# Patient Record
Sex: Male | Born: 1950 | Race: White | Hispanic: No | Marital: Married | Smoking: Never smoker
Health system: Southern US, Community
[De-identification: ages and names within clinical notes are randomized; demographics above are authoritative.]

## PROBLEM LIST (undated history)

## (undated) DIAGNOSIS — Z789 Other specified health status: Secondary | ICD-10-CM

## (undated) DIAGNOSIS — K602 Anal fissure, unspecified: Secondary | ICD-10-CM

## (undated) HISTORY — DX: Anal fissure, unspecified: K60.2

## (undated) HISTORY — PX: KNEE ARTHROSCOPY W/ ACL RECONSTRUCTION: SHX1858

---

## 2009-08-26 LAB — HM DIABETES EYE EXAM

## 2010-07-26 DIAGNOSIS — K602 Anal fissure, unspecified: Secondary | ICD-10-CM

## 2010-07-26 HISTORY — DX: Anal fissure, unspecified: K60.2

## 2011-03-08 ENCOUNTER — Ambulatory Visit: Payer: Self-pay | Admitting: Internal Medicine

## 2011-04-10 ENCOUNTER — Encounter: Payer: Self-pay | Admitting: Internal Medicine

## 2011-04-10 ENCOUNTER — Ambulatory Visit (INDEPENDENT_AMBULATORY_CARE_PROVIDER_SITE_OTHER): Payer: BC Managed Care – PPO | Admitting: Internal Medicine

## 2011-04-10 ENCOUNTER — Other Ambulatory Visit (INDEPENDENT_AMBULATORY_CARE_PROVIDER_SITE_OTHER): Payer: BC Managed Care – PPO

## 2011-04-10 VITALS — BP 102/62 | HR 57 | Temp 97.0°F | Ht 74.0 in | Wt 187.0 lb

## 2011-04-10 DIAGNOSIS — Z0000 Encounter for general adult medical examination without abnormal findings: Secondary | ICD-10-CM | POA: Insufficient documentation

## 2011-04-10 DIAGNOSIS — R400 Somnolence: Secondary | ICD-10-CM | POA: Insufficient documentation

## 2011-04-10 DIAGNOSIS — E781 Pure hyperglyceridemia: Secondary | ICD-10-CM | POA: Insufficient documentation

## 2011-04-10 DIAGNOSIS — K602 Anal fissure, unspecified: Secondary | ICD-10-CM | POA: Insufficient documentation

## 2011-04-10 DIAGNOSIS — R404 Transient alteration of awareness: Secondary | ICD-10-CM

## 2011-04-10 DIAGNOSIS — Z23 Encounter for immunization: Secondary | ICD-10-CM

## 2011-04-10 LAB — URINALYSIS, ROUTINE W REFLEX MICROSCOPIC
Specific Gravity, Urine: >=1.03 (ref 1.000–1.030)
Total Protein, Urine: NEGATIVE
Urine Glucose: NEGATIVE
Urobilinogen, UA: 0.2 (ref 0.0–1.0)

## 2011-04-10 LAB — COMPREHENSIVE METABOLIC PANEL
ALT: 25 U/L (ref 0–53)
Alkaline Phosphatase: 76 U/L (ref 39–117)
Sodium: 144 mEq/L (ref 135–145)
Total Bilirubin: 0.9 mg/dL (ref 0.3–1.2)
Total Protein: 7.4 g/dL (ref 6.0–8.3)

## 2011-04-10 LAB — LIPID PANEL
HDL: 49 mg/dL (ref >39.00)
Total CHOL/HDL Ratio: 3
VLDL: 6.8 mg/dL (ref 0.0–40.0)

## 2011-04-10 LAB — CBC WITH DIFFERENTIAL/PLATELET
Eosinophils Relative: 1.7 % (ref 0.0–5.0)
HCT: 44.1 % (ref 39.0–52.0)
Hemoglobin: 15.1 g/dL (ref 13.0–17.0)
Lymphs Abs: 1.8 10*3/uL (ref 0.7–4.0)
MCV: 93.3 fl (ref 78.0–100.0)
Monocytes Relative: 9.9 % (ref 3.0–12.0)
Neutro Abs: 4.1 10*3/uL (ref 1.4–7.7)
WBC: 6.7 10*3/uL (ref 4.5–10.5)

## 2011-04-10 NOTE — Assessment & Plan Note (Signed)
FLP today 

## 2011-04-10 NOTE — Assessment & Plan Note (Signed)
Referral for sleep evaluation

## 2011-04-10 NOTE — Progress Notes (Signed)
  Subjective:    Patient ID: Stephen Robles, male    DOB: 12-04-1950, 60 y.o.   MRN: [id number]  HPI New to me for a complete physical but he also complains of a persistently painful anal fissure and that he falls asleep while driving - he did a sleep study 5 years ago for ? Reason but he tells me that the room was too cold so he did not fall asleep and the study did not reveal any useful info. He tells me that his wife sleeps too soundly to notice if he snores, thrashes, or has apnea but he does complain of insomnia with frequent awakenings.   Review of Systems  Constitutional: Negative.   HENT: Negative.   Eyes: Negative.   Respiratory: Negative.   Cardiovascular: Negative.   Gastrointestinal: Positive for rectal pain. Negative for nausea, vomiting, abdominal pain, diarrhea, constipation, blood in stool, abdominal distention and anal bleeding.  Genitourinary: Negative.   Musculoskeletal: Negative.   Skin: Negative.   Neurological: Negative.   Hematological: Negative.   Psychiatric/Behavioral: Negative.        Objective:   Physical Exam  Vitals reviewed. Constitutional: He is oriented to person, place, and time. He appears well-developed and well-nourished. No distress.  HENT:  Head: Normocephalic and atraumatic.  Right Ear: External ear normal.  Left Ear: External ear normal.  Nose: Nose normal.  Mouth/Throat: Oropharynx is clear and moist. No oropharyngeal exudate.  Eyes: Conjunctivae and EOM are normal. Pupils are equal, round, and reactive to light. Right eye exhibits no discharge. Left eye exhibits no discharge. No scleral icterus.  Neck: Normal range of motion. Neck supple. No JVD present. No tracheal deviation present. No thyromegaly present.  Cardiovascular: Normal rate, regular rhythm, normal heart sounds and intact distal pulses.  Exam reveals no gallop and no friction rub.   No murmur heard. Pulmonary/Chest: Effort normal and breath sounds normal. No stridor. No  respiratory distress. He has no wheezes. He has no rales. He exhibits no tenderness.  Abdominal: Soft. Bowel sounds are normal. He exhibits no distension and no mass. There is no tenderness. There is no rebound and no guarding. Hernia confirmed negative in the right inguinal area.  Genitourinary: Prostate normal, testes normal and penis normal. Rectal exam shows external hemorrhoid, internal hemorrhoid and fissure. Rectal exam shows no mass, no tenderness and anal tone normal. Guaiac negative stool. Prostate is not enlarged and not tender. Right testis shows no mass, no swelling and no tenderness. Right testis is descended. Cremasteric reflex is not absent on the right side. Left testis shows no mass, no swelling and no tenderness. Left testis is descended. Cremasteric reflex is not absent on the left side. Circumcised. No penile erythema or penile tenderness. No discharge found.  Musculoskeletal: Normal range of motion. He exhibits no edema and no tenderness.  Lymphadenopathy:    He has no cervical adenopathy.       Right: No inguinal adenopathy present.       Left: No inguinal adenopathy present.  Neurological: He is alert and oriented to person, place, and time. He has normal reflexes. He displays normal reflexes. No cranial nerve deficit. He exhibits normal muscle tone. Coordination normal.  Skin: Skin is warm and dry. No rash noted. He is not diaphoretic. No erythema. No pallor.  Psychiatric: He has a normal mood and affect. His behavior is normal. Judgment and thought content normal.         Assessment & Plan:

## 2011-04-10 NOTE — Assessment & Plan Note (Signed)
Exam done, labs ordered, and pt ed material was given 

## 2011-04-10 NOTE — Patient Instructions (Signed)
Health Maintenance in Males MAINTAIN REGULAR HEALTH EXAMS  Maintain a healthy diet and normal weight. Increased weight leads to problems with blood pressure and diabetes. Decrease fat in the diet and increase exercise. Obtain a proper diet from your caregiver if necessary.   High blood pressure causes heart and blood vessel problems. Check blood pressures regularly and keep your blood pressure at normal limits. Aerobic exercise helps this. Persistent elevations of blood pressure should be treated with medications if weight loss and exercise are ineffective.   Avoid smoking, drinking in excess (more than 2 drinks per day), or use of street drugs. Do not share needles with anyone. Ask for help if you need assistance or instructions on stopping the use of alcohol, cigarettes, or drugs.   Maintain normal blood lipids and cholesterol. Your caregiver can give you information to lower your risk of heart disease or stroke.   Ask your caregiver if you are in need of early heart disease screening because of a strong family history of heart disease or signs of elevated testosterone (male sex hormone) levels. These can predispose you to early heart disease.   Practice safe sex. Practicing safe sex decreases your risk for a sexually transmitted infection (STI). Some of the STIs are gonorrhea, chlamydia, syphilis, trichimonas, herpes, human papillomavirus (HPV), and human immunodeficiency virus (HIV). Herpes, HIV, and HPV are viral illnesses that have no cure. These can result in disability, cancer, and death.   It is not safe for someone who has AIDS or is HIV positive to have unprotected sex with a partner who is HIV positive. The reason for this is the fact that there are many different strains of HIV. If you have a strain that is readily treated with medications and then suddenly introduce a strain from a partner that has no further treatment options, you may suddenly have a strain of HIV that is untreatable.  Even if you are both positive for HIV, it is still necessary to practice safe sex.   Use sunscreen with a SPF of 15 or greater. Being outside in the sun when your shadow caused by the sun is shorter than you are, means you are being exposed to sun at greater intensity. Lighter skinned people are at a greater risk of skin cancer.   Keep carbon monoxide and smoke detectors in your home and functioning at all times. Change the batteries every 6 months.   Do monthly examinations of your testicles. The best time to do this is after a hot shower or bath when the tissues are loose. Notify your caregivers of any lumps, tenderness, or changes in size or shape.   Notify your caregiver of new moles or changes in moles, especially if there is a change in shape or color. Also notify your caregiver if a mole is larger than the size of a pencil eraser.   Stay current with your tetanus shots and other required immunizations.  The Body Mass Index (BMI) is a way of measuring how much of your body is fat. Having a BMI above 27 increases the risk of heart disease, diabetes, hypertension, stroke, and other problems related to obesity. Document Released: 02/08/2008 Document Re-Released: 01/30/2010 [employer] Patient Information 2011 [employer]. 

## 2011-04-11 ENCOUNTER — Telehealth: Payer: Self-pay

## 2011-04-11 MED ORDER — NONFORMULARY OR COMPOUNDED ITEM: Status: DC

## 2011-04-11 NOTE — Telephone Encounter (Signed)
Per pt he was told to call back and give MD the name of a RX for refill. Medication shows [id number] 2-3-3% apply to area 3-4 times daily. He wants this  [facility] at [phone]

## 2011-04-12 ENCOUNTER — Encounter: Payer: Self-pay | Admitting: Internal Medicine

## 2011-04-18 ENCOUNTER — Telehealth: Payer: Self-pay | Admitting: *Deleted

## 2011-04-18 NOTE — Telephone Encounter (Signed)
Pt left vm req lead level. Left pt VM to inform him that it was at the bottom of the 2nd page of lab letter (that he said in mess that he received)

## 2011-04-25 NOTE — Telephone Encounter (Signed)
error 

## 2011-05-09 ENCOUNTER — Institutional Professional Consult (permissible substitution): Payer: BC Managed Care – PPO | Admitting: Pulmonary Disease

## 2012-01-27 ENCOUNTER — Encounter (HOSPITAL_COMMUNITY): Payer: Self-pay | Admitting: Pharmacy Technician

## 2012-01-27 ENCOUNTER — Encounter (INDEPENDENT_AMBULATORY_CARE_PROVIDER_SITE_OTHER): Payer: BC Managed Care – PPO | Admitting: Ophthalmology

## 2012-01-27 ENCOUNTER — Encounter (HOSPITAL_COMMUNITY): Payer: Self-pay | Admitting: *Deleted

## 2012-01-27 DIAGNOSIS — H2510 Age-related nuclear cataract, unspecified eye: Secondary | ICD-10-CM

## 2012-01-27 DIAGNOSIS — H4310 Vitreous hemorrhage, unspecified eye: Secondary | ICD-10-CM

## 2012-01-27 DIAGNOSIS — H33009 Unspecified retinal detachment with retinal break, unspecified eye: Secondary | ICD-10-CM

## 2012-01-27 DIAGNOSIS — H43819 Vitreous degeneration, unspecified eye: Secondary | ICD-10-CM

## 2012-01-27 DIAGNOSIS — H33309 Unspecified retinal break, unspecified eye: Secondary | ICD-10-CM

## 2012-01-27 MED ORDER — GATIFLOXACIN 0.5 % OP SOLN
1.0000 [drp] | OPHTHALMIC | Status: AC | PRN
  Administered 2012-01-28 (×3): 1 [drp] via OPHTHALMIC
  Filled 2012-01-27: qty 2.5

## 2012-01-27 MED ORDER — TROPICAMIDE 1 % OP SOLN
1.0000 [drp] | OPHTHALMIC | Status: AC | PRN
  Administered 2012-01-28 (×3): 1 [drp] via OPHTHALMIC
  Filled 2012-01-27: qty 3

## 2012-01-27 MED ORDER — PHENYLEPHRINE HCL 2.5 % OP SOLN
1.0000 [drp] | OPHTHALMIC | Status: AC | PRN
  Administered 2012-01-28 (×3): 1 [drp] via OPHTHALMIC
  Filled 2012-01-27: qty 3

## 2012-01-27 MED ORDER — CYCLOPENTOLATE HCL 1 % OP SOLN
1.0000 [drp] | OPHTHALMIC | Status: AC | PRN
  Administered 2012-01-28 (×3): 1 [drp] via OPHTHALMIC
  Filled 2012-01-27: qty 2

## 2012-01-27 MED ORDER — CEFAZOLIN SODIUM 1-5 GM-% IV SOLN
1.0000 g | INTRAVENOUS | Status: AC
  Administered 2012-01-28: 1 g via INTRAVENOUS
  Filled 2012-01-27: qty 50

## 2012-01-27 NOTE — H&P (Signed)
Stephen Robles is an 61 y.o. male.   Chief Complaint: sudden loss of vision right eye HPI: Healthy man with myopia and retinal detachment right eye  Past Medical History  Diagnosis Date  . Anal fissure 07/2010    he saw ? surgeon in [place] and was told he needed surgery but he has not done that due to $    Past Surgical History  Procedure Date  . Knee arthroscopy w/ acl reconstruction     Family History  Problem Relation Age of Onset  . Cancer Neg Hx   . Diabetes Neg Hx   . Heart disease Neg Hx   . Hyperlipidemia Neg Hx   . Hypertension Neg Hx   . Kidney disease Neg Hx    Social History:  reports that he has never smoked. He does not have any smokeless tobacco history on file. He reports that he drinks about 1.2 ounces of alcohol per week. He reports that he does not use illicit drugs.  Allergies: No Known Allergies   (Not in a hospital admission)  Review of systems otherwise negative  There were no vitals taken for this visit.  Physical exam: Mental status: oriented x3. Eyes: See eye exam associated with this date of surgery in media tab.  Scanned in by scanning center Ears, Nose, Throat: within normal limits Neck: Within Normal limits General: within normal limits Chest: Within normal limits Breast: deferred Heart: Within normal limits Abdomen: Within normal limits GU: deferred Extremities: within normal limits Skin: within normal limits  Assessment/Plan Rhegmatogenous Retinal detachment right eye Plan: To [facility] for Scleral buckle, laser treatment and gas injection right eye  Lynna Zamorano, Beulah Gandy 01/27/2012, 1:28 PM

## 2012-01-28 ENCOUNTER — Encounter (HOSPITAL_COMMUNITY): Payer: Self-pay | Admitting: *Deleted

## 2012-01-28 ENCOUNTER — Ambulatory Visit (HOSPITAL_COMMUNITY)
Admission: RE | Admit: 2012-01-28 | Discharge: 2012-01-29 | Disposition: A | Discharge status: Home / Self Care | Payer: BC Managed Care – PPO | Source: Ambulatory Visit | Attending: Ophthalmology | Admitting: Ophthalmology

## 2012-01-28 ENCOUNTER — Encounter (HOSPITAL_COMMUNITY)
Admission: RE | Disposition: A | Discharge status: Home / Self Care | Payer: Self-pay | Source: Ambulatory Visit | Attending: Ophthalmology

## 2012-01-28 ENCOUNTER — Encounter (HOSPITAL_COMMUNITY): Payer: Self-pay | Admitting: Anesthesiology

## 2012-01-28 ENCOUNTER — Ambulatory Visit (HOSPITAL_COMMUNITY): Payer: BC Managed Care – PPO | Admitting: Anesthesiology

## 2012-01-28 DIAGNOSIS — H33009 Unspecified retinal detachment with retinal break, unspecified eye: Secondary | ICD-10-CM

## 2012-01-28 HISTORY — PX: SCLERAL BUCKLE: SHX5340

## 2012-01-28 HISTORY — PX: GAS INSERTION: SHX5336

## 2012-01-28 HISTORY — DX: Other specified health status: Z78.9

## 2012-01-28 LAB — CBC
Hemoglobin: 13.2 g/dL (ref 13.0–17.0)
MCH: 31.1 pg (ref 26.0–34.0)
MCHC: 33.8 g/dL (ref 30.0–36.0)
Platelets: 217 10*3/uL (ref 150–400)
RDW: 14 % (ref 11.5–15.5)

## 2012-01-28 LAB — SURGICAL PCR SCREEN
MRSA, PCR: NEGATIVE
Staphylococcus aureus: POSITIVE — AB

## 2012-01-28 SURGERY — SCLERAL BUCKLE
Anesthesia: General | Site: Eye | Laterality: Right | Wound class: Clean

## 2012-01-28 MED ORDER — LACTATED RINGERS IV SOLN: INTRAVENOUS | Status: DC | PRN

## 2012-01-28 MED ORDER — SODIUM CHLORIDE 0.9 % IV SOLN
INTRAVENOUS | Status: DC | PRN
  Administered 2012-01-28 (×2): via INTRAVENOUS

## 2012-01-28 MED ORDER — ONDANSETRON HCL 4 MG/2ML IJ SOLN
INTRAMUSCULAR | Status: DC | PRN
  Administered 2012-01-28: 4 mg via INTRAVENOUS

## 2012-01-28 MED ORDER — SODIUM CHLORIDE 0.9 % IJ SOLN
INTRAMUSCULAR | Status: DC | PRN
  Administered 2012-01-28: 14:00:00

## 2012-01-28 MED ORDER — ONDANSETRON HCL 4 MG/2ML IJ SOLN: 4.0000 mg | Freq: Four times a day (QID) | INTRAMUSCULAR | Status: DC | PRN

## 2012-01-28 MED ORDER — GLYCOPYRROLATE 0.2 MG/ML IJ SOLN
INTRAMUSCULAR | Status: DC | PRN
  Administered 2012-01-28: .5 mg via INTRAVENOUS

## 2012-01-28 MED ORDER — PROPOFOL 10 MG/ML IV EMUL
INTRAVENOUS | Status: DC | PRN
  Administered 2012-01-28: 150 mg via INTRAVENOUS

## 2012-01-28 MED ORDER — MAGNESIUM HYDROXIDE 400 MG/5ML PO SUSP: 15.0000 mL | Freq: Four times a day (QID) | ORAL | Status: DC | PRN

## 2012-01-28 MED ORDER — MUPIROCIN 2 % EX OINT
TOPICAL_OINTMENT | Freq: Two times a day (BID) | CUTANEOUS | Status: DC
  Administered 2012-01-28: 1 via NASAL
  Filled 2012-01-28: qty 22

## 2012-01-28 MED ORDER — MORPHINE SULFATE 2 MG/ML IJ SOLN
1.0000 mg | INTRAMUSCULAR | Status: DC | PRN
  Administered 2012-01-28: 2 mg via INTRAVENOUS

## 2012-01-28 MED ORDER — ROCURONIUM BROMIDE 100 MG/10ML IV SOLN
INTRAVENOUS | Status: DC | PRN
  Administered 2012-01-28: 10 mg via INTRAVENOUS
  Administered 2012-01-28: 50 mg via INTRAVENOUS
  Administered 2012-01-28 (×2): 20 mg via INTRAVENOUS

## 2012-01-28 MED ORDER — PREDNISOLONE ACETATE 1 % OP SUSP
1.0000 [drp] | Freq: Four times a day (QID) | OPHTHALMIC | Status: DC
  Filled 2012-01-28: qty 1

## 2012-01-28 MED ORDER — TETRACAINE HCL 0.5 % OP SOLN
2.0000 [drp] | Freq: Once | OPHTHALMIC | Status: DC
  Filled 2012-01-28: qty 2

## 2012-01-28 MED ORDER — HYDROMORPHONE HCL PF 1 MG/ML IJ SOLN: 0.2500 mg | INTRAMUSCULAR | Status: DC | PRN

## 2012-01-28 MED ORDER — DEXTROSE 5 % IV SOLN
INTRAVENOUS | Status: DC | PRN
  Administered 2012-01-28: 15:00:00 via INTRAVENOUS

## 2012-01-28 MED ORDER — ACETAMINOPHEN 325 MG PO TABS: 325.0000 mg | ORAL_TABLET | ORAL | Status: DC | PRN

## 2012-01-28 MED ORDER — DEXAMETHASONE SODIUM PHOSPHATE 10 MG/ML IJ SOLN
INTRAMUSCULAR | Status: DC | PRN
  Administered 2012-01-28: 10 mg

## 2012-01-28 MED ORDER — LIDOCAINE HCL (CARDIAC) 20 MG/ML IV SOLN
INTRAVENOUS | Status: DC | PRN
  Administered 2012-01-28: 50 mg via INTRAVENOUS

## 2012-01-28 MED ORDER — ATROPINE SULFATE 1 % OP SOLN
1.0000 [drp] | OPHTHALMIC | Status: DC
  Filled 2012-01-28: qty 2

## 2012-01-28 MED ORDER — MIDAZOLAM HCL 5 MG/5ML IJ SOLN
INTRAMUSCULAR | Status: DC | PRN
  Administered 2012-01-28: 2 mg via INTRAVENOUS

## 2012-01-28 MED ORDER — ACETAMINOPHEN 10 MG/ML IV SOLN: 1000.0000 mg | Freq: Once | INTRAVENOUS | Status: DC | PRN

## 2012-01-28 MED ORDER — SODIUM CHLORIDE 0.9 % IV SOLN: INTRAVENOUS | Status: DC

## 2012-01-28 MED ORDER — TEMAZEPAM 15 MG PO CAPS: 15.0000 mg | ORAL_CAPSULE | Freq: Every evening | ORAL | Status: DC | PRN

## 2012-01-28 MED ORDER — FENTANYL CITRATE 0.05 MG/ML IJ SOLN
INTRAMUSCULAR | Status: DC | PRN
  Administered 2012-01-28: 150 ug via INTRAVENOUS
  Administered 2012-01-28: 100 ug via INTRAVENOUS

## 2012-01-28 MED ORDER — BACITRACIN-POLYMYXIN B 500-10000 UNIT/GM OP OINT
TOPICAL_OINTMENT | OPHTHALMIC | Status: DC | PRN
  Administered 2012-01-28: 1 via OPHTHALMIC

## 2012-01-28 MED ORDER — SODIUM CHLORIDE 0.45 % IV SOLN: INTRAVENOUS | Status: DC

## 2012-01-28 MED ORDER — BSS IO SOLN
INTRAOCULAR | Status: DC | PRN
  Administered 2012-01-28: 15 mL via INTRAOCULAR

## 2012-01-28 MED ORDER — ACETAZOLAMIDE SODIUM 500 MG IJ SOLR
500.0000 mg | Freq: Once | INTRAMUSCULAR | Status: AC
  Administered 2012-01-29: 500 mg via INTRAVENOUS
  Filled 2012-01-28: qty 500

## 2012-01-28 MED ORDER — BACITRACIN-POLYMYXIN B 500-10000 UNIT/GM OP OINT
1.0000 "application " | TOPICAL_OINTMENT | Freq: Four times a day (QID) | OPHTHALMIC | Status: DC
  Filled 2012-01-28 (×3): qty 3.5

## 2012-01-28 MED ORDER — LIDOCAINE HCL 4 % MT SOLN
OROMUCOSAL | Status: DC | PRN
  Administered 2012-01-28: 4 mL via TOPICAL

## 2012-01-28 MED ORDER — GATIFLOXACIN 0.5 % OP SOLN
1.0000 [drp] | Freq: Four times a day (QID) | OPHTHALMIC | Status: DC
  Filled 2012-01-28: qty 2.5

## 2012-01-28 MED ORDER — BUPIVACAINE HCL 0.75 % IJ SOLN
INTRAMUSCULAR | Status: DC | PRN
  Administered 2012-01-28: 10 mL

## 2012-01-28 MED ORDER — LATANOPROST 0.005 % OP SOLN
1.0000 [drp] | Freq: Every day | OPHTHALMIC | Status: DC
  Filled 2012-01-28: qty 2.5

## 2012-01-28 MED ORDER — HEMOSTATIC AGENTS (NO CHARGE) OPTIME
TOPICAL | Status: DC | PRN
  Administered 2012-01-28: 1 via TOPICAL

## 2012-01-28 MED ORDER — BRIMONIDINE TARTRATE 0.2 % OP SOLN
1.0000 [drp] | Freq: Two times a day (BID) | OPHTHALMIC | Status: DC
  Filled 2012-01-28: qty 5

## 2012-01-28 MED ORDER — MUPIROCIN 2 % EX OINT
TOPICAL_OINTMENT | CUTANEOUS | Status: AC
  Administered 2012-01-28: 1 via NASAL
  Filled 2012-01-28: qty 22

## 2012-01-28 MED ORDER — OXYCODONE-ACETAMINOPHEN 5-325 MG PO TABS: 1.0000 | ORAL_TABLET | ORAL | Status: DC | PRN

## 2012-01-28 MED ORDER — MORPHINE SULFATE 2 MG/ML IJ SOLN
INTRAMUSCULAR | Status: AC
  Filled 2012-01-28: qty 1

## 2012-01-28 MED ORDER — ATROPINE SULFATE 1 % OP SOLN
OPHTHALMIC | Status: DC | PRN
  Administered 2012-01-28: 1 [drp] via OPHTHALMIC

## 2012-01-28 MED ORDER — ONDANSETRON HCL 4 MG/2ML IJ SOLN: 4.0000 mg | Freq: Once | INTRAMUSCULAR | Status: DC | PRN

## 2012-01-28 MED ORDER — NEOSTIGMINE METHYLSULFATE 1 MG/ML IJ SOLN
INTRAMUSCULAR | Status: DC | PRN
  Administered 2012-01-28: 4 mg via INTRAVENOUS

## 2012-01-28 SURGICAL SUPPLY — 66 items
APPLICATOR DR MATTHEWS STRL (MISCELLANEOUS) ×20 IMPLANT
BAND SCLERAL BUCKLING TYPE 240 (Ophthalmic Related) ×2 IMPLANT
BLADE EYE CATARACT 19 1.4 BEAV (BLADE) IMPLANT
BLADE MVR KNIFE 19G (BLADE) IMPLANT
BLADE SURG 15 STRL LF DISP TIS (BLADE) IMPLANT
BLADE SURG 15 STRL SS (BLADE)
CANNULA ANT CHAM MAIN (OPHTHALMIC RELATED) IMPLANT
CANNULA DUAL BORE 23G (CANNULA) IMPLANT
CORDS BIPOLAR (ELECTRODE) IMPLANT
COTTONBALL LRG STERILE PKG (GAUZE/BANDAGES/DRESSINGS) ×6 IMPLANT
COVER SURGICAL LIGHT HANDLE (MISCELLANEOUS) ×2 IMPLANT
DRAPE OPHTHALMIC 77X100 STRL (CUSTOM PROCEDURE TRAY) ×2 IMPLANT
ERASER HMR WETFIELD 23G BP (MISCELLANEOUS) IMPLANT
FILTER BLUE MILLIPORE (MISCELLANEOUS) ×4 IMPLANT
FILTER STRAW FLUID ASPIR (MISCELLANEOUS) IMPLANT
GAS OPHTHALMIC (MISCELLANEOUS) ×2 IMPLANT
GLOVE SS BIOGEL STRL SZ 6.5 (GLOVE) ×3 IMPLANT
GLOVE SS BIOGEL STRL SZ 7 (GLOVE) ×1 IMPLANT
GLOVE SUPERSENSE BIOGEL SZ 6.5 (GLOVE) ×3
GLOVE SUPERSENSE BIOGEL SZ 7 (GLOVE) ×1
GLOVE SURG 8.5 LATEX PF (GLOVE) ×2 IMPLANT
GOWN STRL NON-REIN LRG LVL3 (GOWN DISPOSABLE) ×8 IMPLANT
ILLUMINATOR CHOW PICK 25GA (MISCELLANEOUS) IMPLANT
KIT BASIN OR (CUSTOM PROCEDURE TRAY) ×2 IMPLANT
KIT PERFLUORON PROCEDURE 5ML (MISCELLANEOUS) IMPLANT
KIT ROOM TURNOVER OR (KITS) ×2 IMPLANT
KNIFE CRESCENT 1.75 EDGEAHEAD (BLADE) IMPLANT
KNIFE GRIESHABER SHARP 2.5MM (MISCELLANEOUS) ×6 IMPLANT
MARKER SKIN DUAL TIP RULER LAB (MISCELLANEOUS) IMPLANT
NEEDLE 18GX1X1/2 (RX/OR ONLY) (NEEDLE) ×2 IMPLANT
NEEDLE 25GX 5/8IN NON SAFETY (NEEDLE) IMPLANT
NEEDLE 27GAX1X1/2 (NEEDLE) IMPLANT
NEEDLE HYPO 30X.5 LL (NEEDLE) ×4 IMPLANT
NS IRRIG 1000ML POUR BTL (IV SOLUTION) ×2 IMPLANT
PACK VITRECTOMY CUSTOM (CUSTOM PROCEDURE TRAY) ×2 IMPLANT
PAD ARMBOARD 7.5X6 YLW CONV (MISCELLANEOUS) ×4 IMPLANT
PAD EYE OVAL STERILE LF (GAUZE/BANDAGES/DRESSINGS) IMPLANT
PAK VITRECTOMY PIK 25 GA (OPHTHALMIC RELATED) IMPLANT
PROBE DIRECTIONAL LASER (MISCELLANEOUS) IMPLANT
REPL STRA BRUSH NEEDLE (NEEDLE) IMPLANT
RESERVOIR BACK FLUSH (MISCELLANEOUS) IMPLANT
ROLLS DENTAL (MISCELLANEOUS) ×4 IMPLANT
SET FLUID INJECTOR (SET/KITS/TRAYS/PACK) IMPLANT
SET VGFI TUBING 8065808002 (SET/KITS/TRAYS/PACK) IMPLANT
SPEAR EYE SURG WECK-CEL (MISCELLANEOUS) ×6 IMPLANT
SPONGE GROOVED SILICONE 4X12X8 (Ophthalmic Related) ×2 IMPLANT
SPONGE SURGIFOAM ABS GEL 12-7 (HEMOSTASIS) ×2 IMPLANT
STOPCOCK 4 WAY LG BORE MALE ST (IV SETS) IMPLANT
SUT CHROMIC 7 0 TG140 8 (SUTURE) ×2 IMPLANT
SUT ETHILON 9 0 TG140 8 (SUTURE) IMPLANT
SUT MERSILENE 4 0 RV 2 (SUTURE) ×4 IMPLANT
SUT SILK 2 0 (SUTURE) ×1
SUT SILK 2-0 18XBRD TIE 12 (SUTURE) ×1 IMPLANT
SUT SILK 4 0 RB 1 (SUTURE) ×2 IMPLANT
SUT VICRYL 7 0 TG140 8 (SUTURE) IMPLANT
SYR 20CC LL (SYRINGE) ×2 IMPLANT
SYR 5ML LL (SYRINGE) IMPLANT
SYR BULB 3OZ (MISCELLANEOUS) ×2 IMPLANT
SYR TB 1ML LUER SLIP (SYRINGE) IMPLANT
SYRINGE 10CC LL (SYRINGE) ×2 IMPLANT
TIRE 11 SCLERAL TYPE 279 (Ophthalmic Related) ×2 IMPLANT
TOWEL OR 17X24 6PK STRL BLUE (TOWEL DISPOSABLE) ×6 IMPLANT
TUBING ART PRESS 12 MALE/MALE (MISCELLANEOUS) IMPLANT
VITREORETINAL VISCODISSEC (MISCELLANEOUS) IMPLANT
WATER STERILE IRR 1000ML POUR (IV SOLUTION) ×2 IMPLANT
WIPE INSTRUMENT VISIWIPE 73X73 (MISCELLANEOUS) ×2 IMPLANT

## 2012-01-28 NOTE — Procedures (Signed)
Brief Operative note   Preoperative diagnosis:  Pre-Op Diagnosis Codes:    * Retinal detachment with retinal defect, unspecified [361.00] Postoperative diagnosis  Post-Op Diagnosis Codes:    * Retinal detachment with retinal defect, unspecified [361.00]  Procedures: Scleral Buckle right eye with laser treatment and gas injection  Surgeon:  Sherrie George, MD  Assistant:  Rosalie Doctor SA   Anesthesia: General  Specimen: none  Estimated blood loss:  1cc  Complications: none  Patient sent to PACU in good condition  Composed by Sherrie George MD  Dictation number: [phone]

## 2012-01-28 NOTE — Anesthesia Procedure Notes (Signed)
Procedure Name: Intubation Date/Time: 01/28/2012 2:38 PM Performed by: Shawanda Sievert S Pre-anesthesia Checklist: Patient identified, Emergency Drugs available, Suction available, Patient being monitored and Timeout performed Patient Re-evaluated:Patient Re-evaluated prior to inductionOxygen Delivery Method: Circle system utilized Preoxygenation: Pre-oxygenation with 100% oxygen Intubation Type: IV induction Ventilation: Mask ventilation without difficulty Laryngoscope Size: Mac and 4 Grade View: Grade I Tube type: Oral Tube size: 7.5 mm Number of attempts: 1 Airway Equipment and Method: Stylet Placement Confirmation: ETT inserted through vocal cords under direct vision,  positive ETCO2 and breath sounds checked- equal and bilateral Secured at: 22 cm Tube secured with: Tape Dental Injury: Teeth and Oropharynx as per pre-operative assessment

## 2012-01-28 NOTE — Preoperative (Signed)
Beta Blockers   Reason not to administer Beta Blockers:Not Applicable 

## 2012-01-28 NOTE — Transfer of Care (Signed)
Immediate Anesthesia Transfer of Care Note  Patient: Stephen Robles  Procedure(s) Performed: Procedure(s) (LRB): SCLERAL BUCKLE (Right) INSERTION OF GAS (Right) DIODE LASER APPLICATION (Right)  Patient Location: PACU  Anesthesia Type: General  Level of Consciousness: sedated  Airway & Oxygen Therapy: Patient Spontanous Breathing and Patient connected to nasal cannula oxygen  Post-op Assessment: Report given to PACU RN and Post -op Vital signs reviewed and stable  Post vital signs: Reviewed and stable  Complications: No apparent anesthesia complications

## 2012-01-28 NOTE — Anesthesia Preprocedure Evaluation (Addendum)
Anesthesia Evaluation  Patient identified by MRN, date of birth, ID band Patient awake    Reviewed: Allergy & Precautions, H&P , NPO status , Patient's Chart, lab work & pertinent test results  Airway Mallampati: I  Neck ROM: Full    Dental  (+) Teeth Intact and Dental Advisory Given   Pulmonary neg pulmonary ROS,  breath sounds clear to auscultation        Cardiovascular negative cardio ROS  Rhythm:Regular Rate:Normal     Neuro/Psych negative neurological ROS  negative psych ROS   GI/Hepatic negative GI ROS, Neg liver ROS,   Endo/Other  negative endocrine ROS  Renal/GU negative Renal ROS     Musculoskeletal negative musculoskeletal ROS (+)   Abdominal   Peds  Hematology negative hematology ROS (+)   Anesthesia Other Findings   Reproductive/Obstetrics                         Anesthesia Physical Anesthesia Plan  ASA: II  Anesthesia Plan: General   Post-op Pain Management:    Induction: Intravenous  Airway Management Planned: Oral ETT  Additional Equipment:   Intra-op Plan:   Post-operative Plan: Extubation in OR  Informed Consent: I have reviewed the patients History and Physical, chart, labs and discussed the procedure including the risks, benefits and alternatives for the proposed anesthesia with the patient or authorized representative who has indicated his/her understanding and acceptance.   Dental advisory given  Plan Discussed with: CRNA and Surgeon  Anesthesia Plan Comments: (Retinal Detacment  Plan GA with oral ETT  Kipp Brood, MD)       Anesthesia Quick Evaluation

## 2012-01-28 NOTE — H&P (Signed)
I examined the patient today and there is no change in the medical status 

## 2012-01-28 NOTE — Op Note (Signed)
NAMEJAMY, Stephen Robles NO.:  [id number]  MEDICAL RECORD NO.:  [id number]  LOCATION:  5126                         FACILITY:  [facility]  PHYSICIAN:  Beulah Gandy. Ashley Royalty, M.D. DATE OF BIRTH:  [birth year]  DATE OF PROCEDURE:  01/28/2012 DATE OF DISCHARGE:                              OPERATIVE REPORT   ADMISSION DIAGNOSIS:  Rhegmatogenous retinal detachment, right eye.  PROCEDURE:  Scleral buckle, right eye; gas fluid exchange, right eye; retinal photocoagulation right eye.  SURGEON:  Beulah Gandy. Ashley Royalty, MD  ASSISTANT:  Rosalie Doctor, SA  ANESTHESIA:  General.  DETAILS:  After usual prep and drape, 360 degree limbal peritomy, isolation of 4 rectus muscles on 2-0 silk scleral dissection for 360 degrees to admit #279 intrascleral implant.  Diathermy placed in the bed.  Additional posterior dissection carried out at 1:30 o'clock beneath the retinal break, 279 implant placed around the globe with 1 mm trim from the posterior edge.  The joint was at 7 o'clock.  240 band placed around the eye with a 270 sleeve at 8 o'clock.  Two sutures per quadrant for a total of 8 scleral sutures were placed.  Perforation site chosen at 11 o'clock in the posterior aspect of the bed.  Large amount of clear colorless subretinal fluid came forth in a controlled manner. C3F8 50% 0.8 mL was injected into the vitreous cavity to reinflate the globe.  Indirect ophthalmoscopy showed the retina to be lying nicely on the scleral buckle with break well supported.  Buckle elements were trimmed and adjusted.  The band was adjusted and trimmed.  Scleral sutures were knotted and the free ends removed.  The indirect ophthalmoscope laser was moved into place.  1118 burns placed around the retinal periphery.  The power was between 500 and 900 mW 1000 microns each and 0.1 seconds each.  The conjunctiva was reapproximated with 7-0 chromic suture.  Polymyxin and gentamicin were irrigated into tenon space.   Atropine solution was applied.  Marcaine was injected around the globe for postop pain.  Decadron 10 mg was injected to the lower subconjunctival space.  Closing pressure was 10 with a Barraquer tonometer.  COMPLICATIONS:  None.  DURATION:  Two hours.  The patient was awakened and taken to recovery in satisfactory condition.     Beulah Gandy. Ashley Royalty, M.D.     JDM/MEDQ  D:  01/28/2012  T:  01/28/2012  Job:  [id number]

## 2012-01-28 NOTE — Anesthesia Postprocedure Evaluation (Signed)
  Anesthesia Post-op Note  Patient: Stephen Robles  Procedure(s) Performed: Procedure(s) (LRB): SCLERAL BUCKLE (Right) INSERTION OF GAS (Right) DIODE LASER APPLICATION (Right)  Patient Location: PACU  Anesthesia Type: General  Level of Consciousness: awake, sedated and patient cooperative  Airway and Oxygen Therapy: Patient Spontanous Breathing and Patient connected to nasal cannula oxygen  Post-op Pain: none  Post-op Assessment: Post-op Vital signs reviewed, Patient's Cardiovascular Status Stable, Respiratory Function Stable, Patent Airway, No signs of Nausea or vomiting and Pain level controlled  Post-op Vital Signs: stable  Complications: No apparent anesthesia complications

## 2012-01-29 ENCOUNTER — Encounter (HOSPITAL_COMMUNITY): Payer: Self-pay | Admitting: Ophthalmology

## 2012-01-29 MED ORDER — GATIFLOXACIN 0.5 % OP SOLN: 1.0000 [drp] | Freq: Four times a day (QID) | OPHTHALMIC | Status: DC

## 2012-01-29 MED ORDER — BRIMONIDINE TARTRATE 0.2 % OP SOLN: 1.0000 [drp] | Freq: Two times a day (BID) | OPHTHALMIC | Status: AC

## 2012-01-29 MED ORDER — PREDNISOLONE ACETATE 1 % OP SUSP: 1.0000 [drp] | Freq: Four times a day (QID) | OPHTHALMIC | Status: AC

## 2012-01-29 MED ORDER — BACITRACIN-POLYMYXIN B 500-10000 UNIT/GM OP OINT: 1.0000 "application " | TOPICAL_OINTMENT | Freq: Four times a day (QID) | OPHTHALMIC | Status: AC

## 2012-01-29 MED ORDER — OXYCODONE-ACETAMINOPHEN 5-325 MG PO TABS: 1.0000 | ORAL_TABLET | ORAL | Status: AC | PRN

## 2012-01-29 NOTE — Progress Notes (Signed)
01/29/2012, 6:39 AM  Mental Status:  Awake, Alert, Oriented  Anterior segment: Cornea  Clear    Anterior Chamber Clear    Lens:   Clear, IOL, Cataract  Intra Ocular Pressure 20 mmHg with Tonopen  Vitreous: Clear 50%gas bubble   Retina:  Attached Good laser reaction   Impression: Excellent result Retina attached   Final Diagnosis: Principal Problem:  *Rhegmatogenous retinal detachment   Plan: start post operative eye drops.  Discharge to home.  Give post operative instructions  Sherrie George 01/29/2012, 6:39 AM

## 2012-01-29 NOTE — Discharge Summary (Signed)
Discharge summary not needed on [facility] patients per medical records. 

## 2012-02-04 ENCOUNTER — Ambulatory Visit (INDEPENDENT_AMBULATORY_CARE_PROVIDER_SITE_OTHER): Payer: BC Managed Care – PPO | Admitting: Ophthalmology

## 2012-02-04 DIAGNOSIS — H33009 Unspecified retinal detachment with retinal break, unspecified eye: Secondary | ICD-10-CM

## 2012-02-17 ENCOUNTER — Ambulatory Visit (INDEPENDENT_AMBULATORY_CARE_PROVIDER_SITE_OTHER): Payer: BC Managed Care – PPO | Admitting: Ophthalmology

## 2012-02-17 DIAGNOSIS — H33009 Unspecified retinal detachment with retinal break, unspecified eye: Secondary | ICD-10-CM

## 2012-03-03 NOTE — Progress Notes (Signed)
This encounter was created in error - please disregard.

## 2012-04-30 ENCOUNTER — Other Ambulatory Visit (INDEPENDENT_AMBULATORY_CARE_PROVIDER_SITE_OTHER): Payer: BC Managed Care – PPO

## 2012-04-30 ENCOUNTER — Ambulatory Visit (INDEPENDENT_AMBULATORY_CARE_PROVIDER_SITE_OTHER): Payer: BC Managed Care – PPO | Admitting: Internal Medicine

## 2012-04-30 ENCOUNTER — Encounter: Payer: Self-pay | Admitting: Internal Medicine

## 2012-04-30 VITALS — BP 126/70 | HR 48 | Temp 98.7°F | Resp 16 | Wt 184.0 lb

## 2012-04-30 DIAGNOSIS — H33009 Unspecified retinal detachment with retinal break, unspecified eye: Secondary | ICD-10-CM

## 2012-04-30 DIAGNOSIS — Z0000 Encounter for general adult medical examination without abnormal findings: Secondary | ICD-10-CM

## 2012-04-30 DIAGNOSIS — N529 Male erectile dysfunction, unspecified: Secondary | ICD-10-CM

## 2012-04-30 DIAGNOSIS — Z23 Encounter for immunization: Secondary | ICD-10-CM

## 2012-04-30 LAB — COMPREHENSIVE METABOLIC PANEL
ALT: 29 U/L (ref 0–53)
Albumin: 4.2 g/dL (ref 3.5–5.2)
CO2: 30 mEq/L (ref 19–32)
Chloride: 101 mEq/L (ref 96–112)
GFR: 84.23 mL/min (ref >60.00)
Glucose, Bld: 77 mg/dL (ref 70–99)
Potassium: 4.5 mEq/L (ref 3.5–5.1)
Sodium: 138 mEq/L (ref 135–145)
Total Protein: 7.5 g/dL (ref 6.0–8.3)

## 2012-04-30 LAB — URINALYSIS, ROUTINE W REFLEX MICROSCOPIC
Leukocytes, UA: NEGATIVE
Nitrite: NEGATIVE
Specific Gravity, Urine: <=1.005 (ref 1.000–1.030)
Urine Glucose: NEGATIVE
Urobilinogen, UA: 0.2 (ref 0.0–1.0)

## 2012-04-30 LAB — TSH: TSH: 1.57 u[IU]/mL (ref 0.35–5.50)

## 2012-04-30 LAB — CBC WITH DIFFERENTIAL/PLATELET
Basophils Absolute: 0.1 10*3/uL (ref 0.0–0.1)
Eosinophils Relative: 1 % (ref 0.0–5.0)
Monocytes Absolute: 0.6 10*3/uL (ref 0.1–1.0)
Monocytes Relative: 6.9 % (ref 3.0–12.0)
Neutrophils Relative %: 63.8 % (ref 43.0–77.0)
Platelets: 271 10*3/uL (ref 150.0–400.0)
RDW: 13.6 % (ref 11.5–14.6)
WBC: 8.4 10*3/uL (ref 4.5–10.5)

## 2012-04-30 LAB — FECAL OCCULT BLOOD, GUAIAC: Fecal Occult Blood: NEGATIVE

## 2012-04-30 LAB — PSA: PSA: 0.81 ng/mL (ref 0.10–4.00)

## 2012-04-30 MED ORDER — SILDENAFIL CITRATE 100 MG PO TABS: 50.0000 mg | ORAL_TABLET | Freq: Every day | ORAL | Status: DC | PRN

## 2012-04-30 NOTE — Progress Notes (Signed)
  Subjective:    Patient ID: Stephen Robles, male    DOB: Jun 01, 1951, 61 y.o.   MRN: [id number]  HPI  He returns for a complete physical - his only complaint is ED and he wants to try viagra.  Review of Systems  Constitutional: Negative.   HENT: Negative.   Eyes: Negative.   Respiratory: Negative.   Cardiovascular: Negative.   Gastrointestinal: Negative.   Genitourinary: Negative.   Musculoskeletal: Negative.   Skin: Negative.   Neurological: Negative.   Hematological: Negative.   Psychiatric/Behavioral: Negative.        Objective:   Physical Exam  Vitals reviewed. Constitutional: He is oriented to person, place, and time. He appears well-developed and well-nourished. No distress.  HENT:  Head: Normocephalic and atraumatic.  Mouth/Throat: Oropharynx is clear and moist. No oropharyngeal exudate.  Eyes: Conjunctivae are normal. Right eye exhibits no discharge. Left eye exhibits no discharge. No scleral icterus.  Neck: Normal range of motion. Neck supple. No JVD present. No tracheal deviation present. No thyromegaly present.  Cardiovascular: Normal rate, regular rhythm, normal heart sounds and intact distal pulses.  Exam reveals no gallop and no friction rub.   No murmur heard. Pulmonary/Chest: Effort normal and breath sounds normal. No stridor. No respiratory distress. He has no wheezes. He has no rales. He exhibits no tenderness.  Abdominal: Soft. Bowel sounds are normal. He exhibits no distension. There is no tenderness. There is no rebound and no guarding. Hernia confirmed negative in the right inguinal area and confirmed negative in the left inguinal area.  Genitourinary: Prostate normal, testes normal and penis normal. Rectal exam shows external hemorrhoid and internal hemorrhoid. Rectal exam shows no fissure, no mass, no tenderness and anal tone normal. Guaiac negative stool. Prostate is not enlarged and not tender. Right testis shows no mass, no swelling and no tenderness.  Right testis is descended. Left testis shows no mass, no swelling and no tenderness. Left testis is descended. Circumcised. No phimosis, paraphimosis, hypospadias, penile erythema or penile tenderness. No discharge found.  Musculoskeletal: Normal range of motion. He exhibits no edema and no tenderness.  Lymphadenopathy:    He has no cervical adenopathy.       Right: No inguinal adenopathy present.       Left: No inguinal adenopathy present.  Neurological: He is oriented to person, place, and time.  Skin: Skin is warm and dry. No rash noted. He is not diaphoretic. No erythema. No pallor.  Psychiatric: He has a normal mood and affect. His behavior is normal. Judgment and thought content normal.      Lab Results  Component Value Date   WBC 7.2 01/28/2012   HGB 13.2 01/28/2012   HCT 39.1 01/28/2012   PLT 217 01/28/2012   GLUCOSE 95 04/10/2011   CHOL 151 04/10/2011   TRIG 34.0 04/10/2011   HDL 49.00 04/10/2011   LDLCALC 95 04/10/2011   ALT 25 04/10/2011   AST 20 04/10/2011   NA 144 04/10/2011   K 4.7 04/10/2011   CL 107 04/10/2011   CREATININE 1.0 04/10/2011   BUN 21 04/10/2011   CO2 31 04/10/2011   TSH 0.81 04/10/2011   PSA 0.87 04/10/2011      Assessment & Plan:

## 2012-04-30 NOTE — Patient Instructions (Signed)
Health Maintenance, Males A healthy lifestyle and preventative care can promote health and wellness.  Maintain regular health, dental, and eye exams.   Eat a healthy diet. Foods like vegetables, fruits, whole grains, low-fat dairy products, and lean protein foods contain the nutrients you need without too many calories. Decrease your intake of foods high in solid fats, added sugars, and salt. Get information about a proper diet from your caregiver, if necessary.   Regular physical exercise is one of the most important things you can do for your health. Most adults should get at least 150 minutes of moderate-intensity exercise (any activity that increases your heart rate and causes you to sweat) each week. In addition, most adults need muscle-strengthening exercises on 2 or more days a week.    Maintain a healthy weight. The body mass index (BMI) is a screening tool to identify possible weight problems. It provides an estimate of body fat based on height and weight. Your caregiver can help determine your BMI, and can help you achieve or maintain a healthy weight. For adults 20 years and older:   A BMI below 18.5 is considered underweight.   A BMI of 18.5 to 24.9 is normal.   A BMI of 25 to 29.9 is considered overweight.   A BMI of 30 and above is considered obese.   Maintain normal blood lipids and cholesterol by exercising and minimizing your intake of saturated fat. Eat a balanced diet with plenty of fruits and vegetables. Blood tests for lipids and cholesterol should begin at age 20 and be repeated every 5 years. If your lipid or cholesterol levels are high, you are over 50, or you are a high risk for heart disease, you may need your cholesterol levels checked more frequently.Ongoing high lipid and cholesterol levels should be treated with medicines, if diet and exercise are not effective.   If you smoke, find out from your caregiver how to quit. If you do not use tobacco, do not start.    If you choose to drink alcohol, do not exceed 2 drinks per day. One drink is considered to be 12 ounces (355 mL) of beer, 5 ounces (148 mL) of wine, or 1.5 ounces (44 mL) of liquor.   Avoid use of street drugs. Do not share needles with anyone. Ask for help if you need support or instructions about stopping the use of drugs.   High blood pressure causes heart disease and increases the risk of stroke. Blood pressure should be checked at least every 1 to 2 years. Ongoing high blood pressure should be treated with medicines if weight loss and exercise are not effective.   If you are 45 to 62 years old, ask your caregiver if you should take aspirin to prevent heart disease.   Diabetes screening involves taking a blood sample to check your fasting blood sugar level. This should be done once every 3 years, after age 45, if you are within normal weight and without risk factors for diabetes. Testing should be considered at a younger age or be carried out more frequently if you are overweight and have at least 1 risk factor for diabetes.   Colorectal cancer can be detected and often prevented. Most routine colorectal cancer screening begins at the age of 50 and continues through age 75. However, your caregiver may recommend screening at an earlier age if you have risk factors for colon cancer. On a yearly basis, your caregiver may provide home test kits to check for hidden   blood in the stool. Use of a small camera at the end of a tube, to directly examine the colon (sigmoidoscopy or colonoscopy), can detect the earliest forms of colorectal cancer. Talk to your caregiver about this at age 50, when routine screening begins. Direct examination of the colon should be repeated every 5 to 10 years through age 75, unless early forms of pre-cancerous polyps or small growths are found.   Hepatitis C blood testing is recommended for all people born from 1945 through 1965 and any individual with known risks for  hepatitis C.   Healthy men should no longer receive prostate-specific antigen (PSA) blood tests as part of routine cancer screening. Consult with your caregiver about prostate cancer screening.   Testicular cancer screening is not recommended for adolescents or adult males who have no symptoms. Screening includes self-exam, caregiver exam, and other screening tests. Consult with your caregiver about any symptoms you have or any concerns you have about testicular cancer.   Practice safe sex. Use condoms and avoid high-risk sexual practices to reduce the spread of sexually transmitted infections (STIs).   Use sunscreen with a sun protection factor (SPF) of 30 or greater. Apply sunscreen liberally and repeatedly throughout the day. You should seek shade when your shadow is shorter than you. Protect yourself by wearing long sleeves, pants, a wide-brimmed hat, and sunglasses year round, whenever you are outdoors.   Notify your caregiver of new moles or changes in moles, especially if there is a change in shape or color. Also notify your caregiver if a mole is larger than the size of a pencil eraser.   A one-time screening for abdominal aortic aneurysm (AAA) and surgical repair of large AAAs by sound wave imaging (ultrasonography) is recommended for ages 65 to 75 years who are current or former smokers.   Stay current with your immunizations.  Document Released: 02/08/2008 Document Revised: 08/01/2011 Document Reviewed: 01/07/2011 [employer] Patient Information 2012 [employer]. 

## 2012-05-01 ENCOUNTER — Encounter (INDEPENDENT_AMBULATORY_CARE_PROVIDER_SITE_OTHER): Payer: BC Managed Care – PPO | Admitting: Ophthalmology

## 2012-05-01 DIAGNOSIS — H33009 Unspecified retinal detachment with retinal break, unspecified eye: Secondary | ICD-10-CM

## 2012-05-01 DIAGNOSIS — H2510 Age-related nuclear cataract, unspecified eye: Secondary | ICD-10-CM

## 2012-05-01 DIAGNOSIS — H43819 Vitreous degeneration, unspecified eye: Secondary | ICD-10-CM

## 2012-05-01 DIAGNOSIS — H5210 Myopia, unspecified eye: Secondary | ICD-10-CM

## 2012-05-01 NOTE — Assessment & Plan Note (Signed)
Exam done, vaccines were reviewed, labs ordered, pt ed material was given 

## 2012-10-29 ENCOUNTER — Ambulatory Visit (INDEPENDENT_AMBULATORY_CARE_PROVIDER_SITE_OTHER): Payer: BC Managed Care – PPO | Admitting: Ophthalmology

## 2012-10-29 DIAGNOSIS — H33009 Unspecified retinal detachment with retinal break, unspecified eye: Secondary | ICD-10-CM

## 2012-10-29 DIAGNOSIS — H538 Other visual disturbances: Secondary | ICD-10-CM

## 2012-10-29 DIAGNOSIS — H43819 Vitreous degeneration, unspecified eye: Secondary | ICD-10-CM

## 2013-02-24 ENCOUNTER — Encounter (INDEPENDENT_AMBULATORY_CARE_PROVIDER_SITE_OTHER): Payer: BC Managed Care – PPO | Admitting: Ophthalmology

## 2013-02-24 DIAGNOSIS — H43819 Vitreous degeneration, unspecified eye: Secondary | ICD-10-CM

## 2013-02-24 DIAGNOSIS — H33009 Unspecified retinal detachment with retinal break, unspecified eye: Secondary | ICD-10-CM

## 2013-02-24 DIAGNOSIS — H35359 Cystoid macular degeneration, unspecified eye: Secondary | ICD-10-CM

## 2013-02-25 ENCOUNTER — Ambulatory Visit: Payer: BC Managed Care – PPO | Admitting: Internal Medicine

## 2013-03-05 ENCOUNTER — Encounter (INDEPENDENT_AMBULATORY_CARE_PROVIDER_SITE_OTHER): Payer: BC Managed Care – PPO | Admitting: Ophthalmology

## 2013-03-05 DIAGNOSIS — H33009 Unspecified retinal detachment with retinal break, unspecified eye: Secondary | ICD-10-CM

## 2013-03-05 DIAGNOSIS — H43819 Vitreous degeneration, unspecified eye: Secondary | ICD-10-CM

## 2013-03-05 DIAGNOSIS — H35359 Cystoid macular degeneration, unspecified eye: Secondary | ICD-10-CM

## 2013-04-05 ENCOUNTER — Encounter (INDEPENDENT_AMBULATORY_CARE_PROVIDER_SITE_OTHER): Payer: BC Managed Care – PPO | Admitting: Ophthalmology

## 2013-04-12 ENCOUNTER — Encounter (INDEPENDENT_AMBULATORY_CARE_PROVIDER_SITE_OTHER): Payer: BC Managed Care – PPO | Admitting: Ophthalmology

## 2013-04-16 ENCOUNTER — Encounter (INDEPENDENT_AMBULATORY_CARE_PROVIDER_SITE_OTHER): Payer: BC Managed Care – PPO | Admitting: Ophthalmology

## 2013-04-16 DIAGNOSIS — H43819 Vitreous degeneration, unspecified eye: Secondary | ICD-10-CM

## 2013-04-16 DIAGNOSIS — H35379 Puckering of macula, unspecified eye: Secondary | ICD-10-CM

## 2013-04-16 DIAGNOSIS — H33009 Unspecified retinal detachment with retinal break, unspecified eye: Secondary | ICD-10-CM

## 2013-06-01 ENCOUNTER — Encounter: Payer: Self-pay | Admitting: Internal Medicine

## 2013-06-01 ENCOUNTER — Ambulatory Visit (INDEPENDENT_AMBULATORY_CARE_PROVIDER_SITE_OTHER): Payer: BC Managed Care – PPO | Admitting: Internal Medicine

## 2013-06-01 ENCOUNTER — Other Ambulatory Visit (INDEPENDENT_AMBULATORY_CARE_PROVIDER_SITE_OTHER): Payer: BC Managed Care – PPO

## 2013-06-01 VITALS — BP 122/80 | HR 60 | Temp 97.2°F | Resp 16 | Ht 74.0 in | Wt 183.0 lb

## 2013-06-01 DIAGNOSIS — Z0000 Encounter for general adult medical examination without abnormal findings: Secondary | ICD-10-CM

## 2013-06-01 DIAGNOSIS — N529 Male erectile dysfunction, unspecified: Secondary | ICD-10-CM

## 2013-06-01 LAB — CBC WITH DIFFERENTIAL/PLATELET
Basophils Relative: 0.7 % (ref 0.0–3.0)
Eosinophils Absolute: 0.1 10*3/uL (ref 0.0–0.7)
Hemoglobin: 14.7 g/dL (ref 13.0–17.0)
Lymphocytes Relative: 18.2 % (ref 12.0–46.0)
MCHC: 34.9 g/dL (ref 30.0–36.0)
MCV: 91.2 fl (ref 78.0–100.0)
Monocytes Absolute: 0.6 10*3/uL (ref 0.1–1.0)
Neutro Abs: 5.6 10*3/uL (ref 1.4–7.7)
RBC: 4.63 Mil/uL (ref 4.22–5.81)
WBC: 7.7 10*3/uL (ref 4.5–10.5)

## 2013-06-01 LAB — URINALYSIS, ROUTINE W REFLEX MICROSCOPIC
Hgb urine dipstick: NEGATIVE
Ketones, ur: NEGATIVE
Nitrite: NEGATIVE
RBC / HPF: NONE SEEN (ref 0–?)
Urine Glucose: NEGATIVE
Urobilinogen, UA: 0.2 (ref 0.0–1.0)
WBC, UA: NONE SEEN (ref 0–?)

## 2013-06-01 LAB — COMPREHENSIVE METABOLIC PANEL
ALT: 24 U/L (ref 0–53)
AST: 24 U/L (ref 0–37)
Alkaline Phosphatase: 65 U/L (ref 39–117)
BUN: 17 mg/dL (ref 6–23)
Calcium: 9.5 mg/dL (ref 8.4–10.5)
Chloride: 102 mEq/L (ref 96–112)
Creatinine, Ser: 1.2 mg/dL (ref 0.4–1.5)
Glucose, Bld: 96 mg/dL (ref 70–99)
Potassium: 4.5 mEq/L (ref 3.5–5.1)
Total Bilirubin: 0.6 mg/dL (ref 0.3–1.2)

## 2013-06-01 LAB — HEPATITIS C ANTIBODY: HCV Ab: NEGATIVE

## 2013-06-01 LAB — LIPID PANEL
Cholesterol: 133 mg/dL (ref 0–200)
VLDL: 10 mg/dL (ref 0.0–40.0)

## 2013-06-01 LAB — PSA: PSA: 0.72 ng/mL (ref 0.10–4.00)

## 2013-06-01 MED ORDER — SILDENAFIL CITRATE 100 MG PO TABS: 50.0000 mg | ORAL_TABLET | ORAL | Status: DC | PRN

## 2013-06-01 NOTE — Patient Instructions (Signed)
Health Maintenance, Males A healthy lifestyle and preventative care can promote health and wellness.  Maintain regular health, dental, and eye exams.  Eat a healthy diet. Foods like vegetables, fruits, whole grains, low-fat dairy products, and lean protein foods contain the nutrients you need without too many calories. Decrease your intake of foods high in solid fats, added sugars, and salt. Get information about a proper diet from your caregiver, if necessary.  Regular physical exercise is one of the most important things you can do for your health. Most adults should get at least 150 minutes of moderate-intensity exercise (any activity that increases your heart rate and causes you to sweat) each week. In addition, most adults need muscle-strengthening exercises on 2 or more days a week.   Maintain a healthy weight. The body mass index (BMI) is a screening tool to identify possible weight problems. It provides an estimate of body fat based on height and weight. Your caregiver can help determine your BMI, and can help you achieve or maintain a healthy weight. For adults 20 years and older:  A BMI below 18.5 is considered underweight.  A BMI of 18.5 to 24.9 is normal.  A BMI of 25 to 29.9 is considered overweight.  A BMI of 30 and above is considered obese.  Maintain normal blood lipids and cholesterol by exercising and minimizing your intake of saturated fat. Eat a balanced diet with plenty of fruits and vegetables. Blood tests for lipids and cholesterol should begin at age 20 and be repeated every 5 years. If your lipid or cholesterol levels are high, you are over 50, or you are a high risk for heart disease, you may need your cholesterol levels checked more frequently.Ongoing high lipid and cholesterol levels should be treated with medicines, if diet and exercise are not effective.  If you smoke, find out from your caregiver how to quit. If you do not use tobacco, do not start.  If you  choose to drink alcohol, do not exceed 2 drinks per day. One drink is considered to be 12 ounces (355 mL) of beer, 5 ounces (148 mL) of wine, or 1.5 ounces (44 mL) of liquor.  Avoid use of street drugs. Do not share needles with anyone. Ask for help if you need support or instructions about stopping the use of drugs.  High blood pressure causes heart disease and increases the risk of stroke. Blood pressure should be checked at least every 1 to 2 years. Ongoing high blood pressure should be treated with medicines if weight loss and exercise are not effective.  If you are 45 to 63 years old, ask your caregiver if you should take aspirin to prevent heart disease.  Diabetes screening involves taking a blood sample to check your fasting blood sugar level. This should be done once every 3 years, after age 45, if you are within normal weight and without risk factors for diabetes. Testing should be considered at a younger age or be carried out more frequently if you are overweight and have at least 1 risk factor for diabetes.  Colorectal cancer can be detected and often prevented. Most routine colorectal cancer screening begins at the age of 50 and continues through age 75. However, your caregiver may recommend screening at an earlier age if you have risk factors for colon cancer. On a yearly basis, your caregiver may provide home test kits to check for hidden blood in the stool. Use of a small camera at the end of a tube,   to directly examine the colon (sigmoidoscopy or colonoscopy), can detect the earliest forms of colorectal cancer. Talk to your caregiver about this at age 50, when routine screening begins. Direct examination of the colon should be repeated every 5 to 10 years through age 75, unless early forms of pre-cancerous polyps or small growths are found.  Hepatitis C blood testing is recommended for all people born from 1945 through 1965 and any individual with known risks for hepatitis C.  Healthy  men should no longer receive prostate-specific antigen (PSA) blood tests as part of routine cancer screening. Consult with your caregiver about prostate cancer screening.  Testicular cancer screening is not recommended for adolescents or adult males who have no symptoms. Screening includes self-exam, caregiver exam, and other screening tests. Consult with your caregiver about any symptoms you have or any concerns you have about testicular cancer.  Practice safe sex. Use condoms and avoid high-risk sexual practices to reduce the spread of sexually transmitted infections (STIs).  Use sunscreen with a sun protection factor (SPF) of 30 or greater. Apply sunscreen liberally and repeatedly throughout the day. You should seek shade when your shadow is shorter than you. Protect yourself by wearing long sleeves, pants, a wide-brimmed hat, and sunglasses year round, whenever you are outdoors.  Notify your caregiver of new moles or changes in moles, especially if there is a change in shape or color. Also notify your caregiver if a mole is larger than the size of a pencil eraser.  A one-time screening for abdominal aortic aneurysm (AAA) and surgical repair of large AAAs by sound wave imaging (ultrasonography) is recommended for ages 65 to 75 years who are current or former smokers.  Stay current with your immunizations. Document Released: 02/08/2008 Document Revised: 11/04/2011 Document Reviewed: 01/07/2011 [employer] Patient Information 2014 [employer].  

## 2013-06-01 NOTE — Progress Notes (Signed)
  Subjective:    Patient ID: Stephen Robles, male    DOB: [birth year], 62 y.o.   MRN: [id number]  HPI  He returns for a physical - he tells me that he feels well and offers no complaints.  Review of Systems  Constitutional: Negative.   HENT: Negative.   Eyes: Negative.   Respiratory: Negative.   Cardiovascular: Negative.   Gastrointestinal: Negative.   Endocrine: Negative.   Genitourinary: Negative.   Musculoskeletal: Negative.   Skin: Negative.   Allergic/Immunologic: Negative.   Neurological: Negative.   Hematological: Negative.   Psychiatric/Behavioral: Negative.   All other systems reviewed and are negative.       Objective:   Physical Exam  Vitals reviewed. Constitutional: He is oriented to person, place, and time. He appears well-developed and well-nourished. No distress.  HENT:  Head: Normocephalic and atraumatic.  Mouth/Throat: Oropharynx is clear and moist. No oropharyngeal exudate.  Eyes: Conjunctivae are normal. Right eye exhibits no discharge. Left eye exhibits no discharge. No scleral icterus.  Neck: Normal range of motion. Neck supple. No JVD present. No tracheal deviation present. No thyromegaly present.  Cardiovascular: Normal rate, regular rhythm, normal heart sounds and intact distal pulses.  Exam reveals no gallop and no friction rub.   No murmur heard. Pulmonary/Chest: Effort normal and breath sounds normal. No stridor. No respiratory distress. He has no wheezes. He has no rales. He exhibits no tenderness.  Abdominal: Soft. Bowel sounds are normal. He exhibits no distension and no mass. There is no tenderness. There is no rebound and no guarding. Hernia confirmed negative in the right inguinal area and confirmed negative in the left inguinal area.  Genitourinary: Prostate normal, testes normal and penis normal. Rectal exam shows external hemorrhoid and internal hemorrhoid. Rectal exam shows no fissure, no mass, no tenderness and anal tone normal. Guaiac  negative stool. Prostate is not enlarged and not tender. Right testis shows no mass, no swelling and no tenderness. Right testis is descended. Left testis shows no mass, no swelling and no tenderness. Left testis is descended. Circumcised. No penile erythema or penile tenderness. No discharge found.  Musculoskeletal: Normal range of motion. He exhibits no edema and no tenderness.  Lymphadenopathy:    He has no cervical adenopathy.       Right: No inguinal adenopathy present.       Left: No inguinal adenopathy present.  Neurological: He is oriented to person, place, and time.  Skin: Skin is warm and dry. No rash noted. He is not diaphoretic. No erythema. No pallor.  Psychiatric: He has a normal mood and affect. His behavior is normal. Judgment and thought content normal.      Lab Results  Component Value Date   WBC 8.4 04/30/2012   HGB 14.6 04/30/2012   HCT 43.7 04/30/2012   PLT 271.0 04/30/2012   GLUCOSE 77 04/30/2012   CHOL 152 04/30/2012   TRIG 39.0 04/30/2012   HDL 42.30 04/30/2012   LDLCALC 102* 04/30/2012   ALT 29 04/30/2012   AST 22 04/30/2012   NA 138 04/30/2012   K 4.5 04/30/2012   CL 101 04/30/2012   CREATININE 1.0 04/30/2012   BUN 18 04/30/2012   CO2 30 04/30/2012   TSH 1.57 04/30/2012   PSA 0.81 04/30/2012      Assessment & Plan:

## 2013-06-01 NOTE — Assessment & Plan Note (Signed)
Exam done Vaccines were reviewed Labs ordered Pt ed material was given 

## 2013-06-04 ENCOUNTER — Encounter: Payer: Self-pay | Admitting: Internal Medicine

## 2013-06-21 ENCOUNTER — Encounter: Payer: Self-pay | Admitting: Family Medicine

## 2013-06-21 ENCOUNTER — Other Ambulatory Visit (INDEPENDENT_AMBULATORY_CARE_PROVIDER_SITE_OTHER): Payer: BC Managed Care – PPO

## 2013-06-21 ENCOUNTER — Telehealth: Payer: Self-pay | Admitting: Internal Medicine

## 2013-06-21 ENCOUNTER — Ambulatory Visit (INDEPENDENT_AMBULATORY_CARE_PROVIDER_SITE_OTHER): Payer: BC Managed Care – PPO | Admitting: Family Medicine

## 2013-06-21 VITALS — BP 128/76 | HR 60 | Wt 185.0 lb

## 2013-06-21 DIAGNOSIS — M25562 Pain in left knee: Secondary | ICD-10-CM

## 2013-06-21 DIAGNOSIS — M7042 Prepatellar bursitis, left knee: Secondary | ICD-10-CM | POA: Insufficient documentation

## 2013-06-21 DIAGNOSIS — M7040 Prepatellar bursitis, unspecified knee: Secondary | ICD-10-CM

## 2013-06-21 DIAGNOSIS — M25569 Pain in unspecified knee: Secondary | ICD-10-CM

## 2013-06-21 MED ORDER — MELOXICAM 15 MG PO TABS: 15.0000 mg | ORAL_TABLET | Freq: Every day | ORAL | Status: DC

## 2013-06-21 NOTE — Progress Notes (Signed)
CC: Left knee swelling  HPI: Patient is a 62 year old gentleman with a past medical history significant for an anterior cruciate ligament repair multiple decades ago on the left knee coming in with acute swelling of the left knee. Patient states that 8 days ago he was doing housework on a 40 foot ladder and did have some straining of the left knee. Patient states the next morning he did have significant amount of swelling. Patient states that the swelling unfortunately did not seem to be getting better over the course of the week. Patient denies any radiation of any pain. Patient describes the pain as more of a constant dull aching or throbbing sensation. Patient finds it difficult to bend his knee. Patient has tried some over-the-counter anti-inflammatories with minimal benefit. Patient is also wearing a compression sleeve which is helpful. Patient denies any fevers or chills. Severity is 3/10 for the pain but unfortunately it is affecting his activities of daily living.   Past medical, surgical, family and social history reviewed. Medications reviewed all in the electronic medical record.   Review of Systems: No headache, visual changes, nausea, vomiting, diarrhea, constipation, dizziness, abdominal pain, skin rash, fevers, chills, night sweats, weight loss, swollen lymph nodes, body aches, joint swelling, muscle aches, chest pain, shortness of breath, mood changes.   Objective:    Blood pressure 128/76, pulse 60, weight 185 lb (83.915 kg), SpO2 94.00%.   General: No apparent distress alert and oriented x3 mood and affect normal, dressed appropriately.  HEENT: Pupils equal, extraocular movements intact Respiratory: Patient's speak in full sentences and does not appear short of breath Cardiovascular: No lower extremity edema, non tender, no erythema Skin: Warm dry intact with no signs of infection or rash on extremities or on axial skeleton. Abdomen: Soft nontender Neuro: Cranial nerves II  through XII are intact, neurovascularly intact in all extremities with 2+ DTRs and 2+ pulses. Lymph: No lymphadenopathy of posterior or anterior cervical chain or axillae bilaterally.  Gait normal with good balance and coordination.  MSK: Non tender with full range of motion and good stability and symmetric strength and tone of shoulders, elbows, wrist, hip,  and ankles bilaterally.   Knee: Left On inspection patient does have what appears to be trace edema but this seems to be only over the patellar bursa. Patient's knee itself does not seem to have an effusion Palpation shows he is tender to palpation over the patella but otherwise fairly unremarkable. ROM full in flexion and extension and lower leg rotation. Ligaments with solid consistent endpoints including ACL, PCL, LCL, MCL. There is mild laxity of the PCL but this is symmetric to the contralateral side. Negative Mcmurray's, Apley's, and Thessalonian tests.  painful patellar compression but I think secondary to compression of the fluid Patellar glide without crepitus. Patellar and quadriceps tendons unremarkable. Hamstring and quadriceps strength is normal.   MSK US performed of: Left knee This study was ordered, performed, and interpreted by Terrilee Files D.O.  Knee: left All structures visualized. Anteromedial, anterolateral, posteromedial, and posterolateral menisci unremarkable without tearing, fraying, effusion, or displacement. Patellar Tendon unremarkable on long and transverse views without effusion. Patient's prepatellar bursa does have significant hypoechoic changes showing that this is likely secondary to fluid accumulation. Patient also has a large calcific accumulations which are likely secondary to a resolving hematoma. LCL and MCL unremarkable on long and transverse views. No abnormality of origin of medial or lateral head of the gastrocnemius.  IMPRESSION: Prepatellar bursitis with no knee effusion.  Procedure:  Real-time Ultrasound Guided Injection of left prerpatellar bursitis.  Device: GE Logiq E  Ultrasound guided injection is preferred based studies that show increased duration, increased effect, greater accuracy, decreased procedural pain, increased response rate, and decreased cost with ultrasound guided versus blind injection.  Verbal informed consent obtained.  Time-out conducted.  Noted no overlying erythema, induration, or other signs of local infection.  Skin prepped in a sterile fashion.  Local anesthesia: Topical Ethyl chloride.  With sterile technique and under real time ultrasound guidance:   The patient did have injection of 0.5% Marcaine with a 23-gauge 1 inch needle. This was removed and then patient was penetrated with an 18-gauge 1-1/2 inch needle into the prepatellar bursa under ultrasound guidance. Patient did have removal of 9 cc of serosanguineous fluid. This did not appear to be infectious. Patient then had injection of 1 mg of Kenalog 40 mg/dL. Completed without difficulty  Pain immediately resolved suggesting accurate placement of the medication.  Advised to call if fevers/chills, erythema, induration, drainage, or persistent bleeding.  Images permanently stored and available for review in the ultrasound unit.  Impression: Technically successful ultrasound guided injection.   Impression and Recommendations:     This case required medical decision making of moderate complexity.

## 2013-06-21 NOTE — Assessment & Plan Note (Signed)
Patient did have fluid removed the did not appear to be infectious etiology. I think this is probably secondary to chronic friction from him being up on a ladder. Patient will continue compression, given 10 days of meloxicam, discussed icing protocol. Patient will return in 4 days to make sure there is not reaccumulation or any signs of infection. Handout was also given to patient discussing diagnosis and prognosis and other treatment options that might be available to him at followup.

## 2013-06-21 NOTE — Patient Instructions (Signed)
Very nice to meet you Ice 20 minutes 2 times daily Meloxicam 10 days daily then as needed Continue compression during day not at night.  Come back again in 4 days or call.
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
## 2013-08-31 ENCOUNTER — Ambulatory Visit (INDEPENDENT_AMBULATORY_CARE_PROVIDER_SITE_OTHER): Payer: BC Managed Care – PPO | Admitting: Ophthalmology

## 2013-08-31 DIAGNOSIS — H33009 Unspecified retinal detachment with retinal break, unspecified eye: Secondary | ICD-10-CM

## 2013-08-31 DIAGNOSIS — H43819 Vitreous degeneration, unspecified eye: Secondary | ICD-10-CM

## 2013-08-31 DIAGNOSIS — H35359 Cystoid macular degeneration, unspecified eye: Secondary | ICD-10-CM

## 2013-10-27 ENCOUNTER — Telehealth: Payer: Self-pay | Admitting: *Deleted

## 2013-10-27 ENCOUNTER — Encounter: Payer: Self-pay | Admitting: *Deleted

## 2013-10-27 NOTE — Telephone Encounter (Signed)
Patient needs this info in writing.  Letter composed, printing and awaiting MD signature-will mail to patient's address on file.

## 2013-10-27 NOTE — Telephone Encounter (Signed)
Patient phoned, stating he is planning to travel to [place] and the health dept has advised him to receive a yellow fever vacc prior to travel & further recommended due to his age, he needed PCP clearance.  Please advise.  CB# [phone]

## 2013-10-27 NOTE — Telephone Encounter (Signed)
Ok with me 

## 2013-11-03 ENCOUNTER — Ambulatory Visit (INDEPENDENT_AMBULATORY_CARE_PROVIDER_SITE_OTHER): Payer: BC Managed Care – PPO | Admitting: Ophthalmology

## 2013-11-11 ENCOUNTER — Ambulatory Visit (INDEPENDENT_AMBULATORY_CARE_PROVIDER_SITE_OTHER): Payer: Self-pay | Admitting: Ophthalmology

## 2013-11-16 ENCOUNTER — Encounter (INDEPENDENT_AMBULATORY_CARE_PROVIDER_SITE_OTHER): Payer: Self-pay | Admitting: General Surgery

## 2013-11-16 ENCOUNTER — Ambulatory Visit (INDEPENDENT_AMBULATORY_CARE_PROVIDER_SITE_OTHER): Payer: BC Managed Care – PPO | Admitting: General Surgery

## 2013-11-16 VITALS — BP 112/82 | HR 70 | Temp 97.6°F | Resp 16 | Ht 74.0 in | Wt 187.0 lb

## 2013-11-16 DIAGNOSIS — K4090 Unilateral inguinal hernia, without obstruction or gangrene, not specified as recurrent: Secondary | ICD-10-CM

## 2013-11-16 NOTE — Progress Notes (Signed)
Patient ID: Stephen Robles, male   DOB: 1951-04-10, 64 y.o.   MRN: [id number]  Chief Complaint  Patient presents with  . New Evaluation    Eval RIH    HPI Stephen Robles is a 63 y.o. male.  The patient is a 63 year old male who is referred by Dr. Cathi Roan for evaluation of a right inguinal hernia. The patient states he noticed a discomfort in his right inguinal area approximately 2 weeks thereafter removing a couple of toilet. He states that since that time has noticed a discomfort. He's noticed no bulge. He said no signs or symptoms of incarceration angulation. HPI  Past Medical History  Diagnosis Date  . Anal fissure 07/2010    he saw ? surgeon in [place] and was told he needed surgery but he has not done that due to $  . No pertinent past medical history     Past Surgical History  Procedure Laterality Date  . Knee arthroscopy w/ acl reconstruction    . Scleral buckle  01/28/2012    Procedure: SCLERAL BUCKLE;  Surgeon: Sherrie George, MD;  Location: [facility] OR;  Service: Ophthalmology;  Laterality: Right;  . Gas insertion  01/28/2012    Procedure: INSERTION OF GAS;  Surgeon: Sherrie George, MD;  Location: [facility] OR;  Service: Ophthalmology;  Laterality: Right;  insertion of [id number]    Family History  Problem Relation Age of Onset  . Cancer Neg Hx   . Diabetes Neg Hx   . Heart disease Neg Hx   . Hyperlipidemia Neg Hx   . Hypertension Neg Hx   . Kidney disease Neg Hx   . Anesthesia problems Neg Hx   . Alcohol abuse Neg Hx   . Early death Neg Hx   . Drug abuse Neg Hx   . Stroke Neg Hx     Social History History  Substance Use Topics  . Smoking status: Never Smoker   . Smokeless tobacco: Never Used  . Alcohol Use: 1.2 oz/week    2 Cans of beer per week    No Known Allergies  Current Outpatient Prescriptions  Medication Sig Dispense Refill  . amoxicillin-clavulanate (AUGMENTIN) 500-125 MG per tablet       . cyclobenzaprine (FLEXERIL) 5 MG tablet       . fluticasone (FLONASE) 50  MCG/ACT nasal spray       . meloxicam (MOBIC) 15 MG tablet Take 1 tablet (15 mg total) by mouth daily.  30 tablet  0   No current facility-administered medications for this visit.    Review of Systems Review of Systems  Constitutional: Negative.   HENT: Negative.   Eyes: Negative.   Respiratory: Negative.   Cardiovascular: Negative.   Gastrointestinal: Negative.   Endocrine: Negative.   Neurological: Negative.     Blood pressure 112/82, pulse 70, temperature 97.6 F (36.4 C), temperature source Oral, resp. rate 16, height 6\' 2"  (1.88 m), weight 187 lb (84.823 kg).  Physical Exam Physical Exam  Constitutional: He is oriented to person, place, and time. He appears well-developed and well-nourished.  HENT:  Head: Normocephalic and atraumatic.  Eyes: Conjunctivae and EOM are normal. Pupils are equal, round, and reactive to light.  Neck: Normal range of motion. Neck supple.  Cardiovascular: Normal rate, regular rhythm and normal heart sounds.   Pulmonary/Chest: Effort normal and breath sounds normal.  Abdominal: Soft. Bowel sounds are normal. He exhibits no distension and no mass. There is no tenderness. There is no rebound and  no guarding. A hernia is present. Hernia confirmed positive in the right inguinal area. Hernia confirmed negative in the left inguinal area.  Musculoskeletal: Normal range of motion.  Neurological: He is alert and oriented to person, place, and time.  Skin: Skin is warm and dry.    Data Reviewed none  Assessment    63 year old male with a right hernia, likely direct     Plan    1. We'll proceed to the operative for laparoscopic right inguinal hernia repair with mesh 2. All risks and benefits were discussed with the patient, to generally include infection, bleeding, damage to surrounding structures, acute and chronic nerve pain, and recurrence. Alternatives were offered and described.  All questions were answered and the patient voiced understanding of  the procedure and wishes to proceed at this point.         Marigene Ehlersamirez Jr., Braidyn Peace 11/16/2013, 9:06 AM

## 2014-02-28 ENCOUNTER — Other Ambulatory Visit (INDEPENDENT_AMBULATORY_CARE_PROVIDER_SITE_OTHER): Payer: Self-pay

## 2014-02-28 DIAGNOSIS — K4090 Unilateral inguinal hernia, without obstruction or gangrene, not specified as recurrent: Secondary | ICD-10-CM

## 2014-02-28 MED ORDER — OXYCODONE-ACETAMINOPHEN 5-325 MG PO TABS: 1.0000 | ORAL_TABLET | Freq: Four times a day (QID) | ORAL | Status: DC | PRN

## 2014-03-01 ENCOUNTER — Telehealth (INDEPENDENT_AMBULATORY_CARE_PROVIDER_SITE_OTHER): Payer: Self-pay | Admitting: General Surgery

## 2014-03-01 NOTE — Telephone Encounter (Signed)
Spoke with pt and informed him his PO will be on 03/28/14 at 11:40.

## 2014-03-28 ENCOUNTER — Ambulatory Visit (INDEPENDENT_AMBULATORY_CARE_PROVIDER_SITE_OTHER): Payer: BC Managed Care – PPO | Admitting: General Surgery

## 2014-03-28 ENCOUNTER — Encounter (INDEPENDENT_AMBULATORY_CARE_PROVIDER_SITE_OTHER): Payer: Self-pay | Admitting: General Surgery

## 2014-03-28 VITALS — BP 114/72 | HR 71 | Temp 97.2°F | Resp 16 | Ht 74.0 in | Wt 184.2 lb

## 2014-03-28 DIAGNOSIS — Z8719 Personal history of other diseases of the digestive system: Secondary | ICD-10-CM

## 2014-03-28 DIAGNOSIS — Z9889 Other specified postprocedural states: Principal | ICD-10-CM

## 2014-03-28 NOTE — Progress Notes (Signed)
Patient ID: Maryellen PileLewis C Ramiro, male   DOB: [birth year], 63 y.o.   MRN: [id number] Post op course Patient is approximately one month postop from a laparoscopic right inguinal hernia repair with mesh. Patient has been doing well postoperatively and has had minimal pain with resolution of discomfort with her preoperatively.  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  On Exam: Wounds are clean dry and intact, there is no hernia on palpation   Assessment and Plan 63 year old male status post laparoscopic right inguinal hernia repair with mesh 1. We discussed with restrictions of 20 pounds for another 2 weeks. 2. The patient follow up as needed   Axel FillerArmando Alayja Armas, MD [facility] General [facility] Surgery Trauma & Emergency Surgery

## 2014-09-01 ENCOUNTER — Ambulatory Visit (INDEPENDENT_AMBULATORY_CARE_PROVIDER_SITE_OTHER): Payer: BLUE CROSS/BLUE SHIELD | Admitting: Ophthalmology

## 2014-09-01 DIAGNOSIS — H338 Other retinal detachments: Secondary | ICD-10-CM

## 2014-09-01 DIAGNOSIS — H43813 Vitreous degeneration, bilateral: Secondary | ICD-10-CM

## 2014-10-14 ENCOUNTER — Other Ambulatory Visit: Payer: Self-pay | Admitting: Internal Medicine

## 2014-10-17 ENCOUNTER — Telehealth: Payer: Self-pay

## 2014-10-17 DIAGNOSIS — N522 Drug-induced erectile dysfunction: Secondary | ICD-10-CM

## 2014-10-17 MED ORDER — SILDENAFIL CITRATE 100 MG PO TABS: 100.0000 mg | ORAL_TABLET | ORAL | Status: DC | PRN

## 2014-10-17 NOTE — Telephone Encounter (Signed)
done

## 2014-10-17 NOTE — Telephone Encounter (Signed)
Rx for Viagra 100 mg takien 1/2 to 1 tab po prn for erectile dysfunction.   faxed in from [facility].   Fax [phone] Phone [phone]

## 2014-10-18 NOTE — Telephone Encounter (Signed)
Rx has been faxed to pt pharmacy.

## 2015-03-27 ENCOUNTER — Ambulatory Visit (INDEPENDENT_AMBULATORY_CARE_PROVIDER_SITE_OTHER): Payer: BLUE CROSS/BLUE SHIELD | Admitting: Internal Medicine

## 2015-03-27 ENCOUNTER — Encounter: Payer: Self-pay | Admitting: Internal Medicine

## 2015-03-27 ENCOUNTER — Ambulatory Visit (INDEPENDENT_AMBULATORY_CARE_PROVIDER_SITE_OTHER)
Admission: RE | Admit: 2015-03-27 | Discharge: 2015-03-27 | Disposition: A | Discharge status: Home / Self Care | Payer: BLUE CROSS/BLUE SHIELD | Source: Ambulatory Visit | Attending: Internal Medicine | Admitting: Internal Medicine

## 2015-03-27 VITALS — BP 100/60 | HR 64 | Temp 97.8°F | Resp 16 | Ht 74.0 in | Wt 187.0 lb

## 2015-03-27 DIAGNOSIS — L03119 Cellulitis of unspecified part of limb: Secondary | ICD-10-CM

## 2015-03-27 DIAGNOSIS — S59901A Unspecified injury of right elbow, initial encounter: Secondary | ICD-10-CM | POA: Diagnosis not present

## 2015-03-27 DIAGNOSIS — S59909A Unspecified injury of unspecified elbow, initial encounter: Secondary | ICD-10-CM | POA: Insufficient documentation

## 2015-03-27 MED ORDER — AMOXICILLIN-POT CLAVULANATE 875-125 MG PO TABS: 1.0000 | ORAL_TABLET | Freq: Two times a day (BID) | ORAL | Status: AC

## 2015-03-27 NOTE — Progress Notes (Signed)
Pre visit review using our clinic review tool, if applicable. No additional management support is needed unless otherwise documented below in the visit note. 

## 2015-03-27 NOTE — Patient Instructions (Signed)

## 2015-03-28 ENCOUNTER — Encounter: Payer: Self-pay | Admitting: Internal Medicine

## 2015-03-28 NOTE — Progress Notes (Signed)
Subjective:  Patient ID: Stephen Robles, male    DOB: [birth year]  Age: 64 y.o. MRN: [id number]  CC: No chief complaint on file.   HPI Stephen Robles presents for right elbow pain and swelling. He thinks he's had a couple of elbow injuries over the last few weeks but his memory fails him on the specific details. He thinks his last elbow injury was about a week or 2 ago. Now for the last 2 days he feels like the elbow has become swollen over the tip with pain, redness and swelling diffusely around the elbow. He has not noticed any limited range of motion in the elbow.  Outpatient Prescriptions Prior to Visit  Medication Sig Dispense Refill  . sildenafil (VIAGRA) 100 MG tablet Take 1 tablet (100 mg total) by mouth as needed for erectile dysfunction. 10 tablet 11  . amoxicillin-clavulanate (AUGMENTIN) 500-125 MG per tablet     . cyclobenzaprine (FLEXERIL) 5 MG tablet     . fluticasone (FLONASE) 50 MCG/ACT nasal spray     . meloxicam (MOBIC) 15 MG tablet Take 1 tablet (15 mg total) by mouth daily. (Patient not taking: Reported on 03/27/2015) 30 tablet 0  . oxyCODONE-acetaminophen (ROXICET) 5-325 MG per tablet Take 1-2 tablets by mouth every 6 (six) hours as needed. (Patient not taking: Reported on 03/27/2015) 30 tablet 0   No facility-administered medications prior to visit.    ROS Review of Systems  Constitutional: Positive for chills. Negative for fever, diaphoresis, activity change, appetite change, fatigue and unexpected weight change.  HENT: Negative.   Eyes: Negative.   Respiratory: Negative.   Cardiovascular: Negative.   Gastrointestinal: Negative.  Negative for abdominal pain.  Endocrine: Negative.   Genitourinary: Negative.   Musculoskeletal: Positive for arthralgias. Negative for back pain.  Skin: Positive for color change.  Allergic/Immunologic: Negative.   Neurological: Negative.   Hematological: Negative.   Psychiatric/Behavioral: Negative.     Objective:  BP 100/60 mmHg   Pulse 64  Temp(Src) 97.8 F (36.6 C) (Oral)  Ht  (1.88 m)  Wt 187 lb (84.823 kg)  BMI 24.00 kg/m2  SpO2 97%  BP Readings from Last 3 Encounters:  03/27/15 100/60  03/28/14 114/72  11/16/13 112/82    Wt Readings from Last 3 Encounters:  03/27/15 187 lb (84.823 kg)  03/28/14 184 lb 3.2 oz (83.553 kg)  11/16/13 187 lb (84.823 kg)    Physical Exam  Constitutional:  Non-toxic appearance. He does not have a sickly appearance. He does not appear ill. No distress.  HENT:  Mouth/Throat: Oropharynx is clear and moist.  Eyes: Conjunctivae are normal.  Neck: Normal range of motion. Neck supple.  Pulmonary/Chest: Effort normal and breath sounds normal.  Musculoskeletal: Normal range of motion. He exhibits tenderness. He exhibits no edema.       Right elbow: He exhibits swelling and effusion. He exhibits normal range of motion, no deformity and no laceration. Tenderness found. Olecranon process tenderness noted. No radial head, no medial epicondyle and no lateral epicondyle tenderness noted.       Arms: The posterior right elbow was cleaned with Betadine. Local anesthesia was obtained with  2% lidocaine with epi, 1.5 mL was used.  A 1.5 inch, 21-gauge needle was used to enter the olecranon bursa. Unfortunately no fluid was aspirated and the joint would not receive an attempted injection of Depo-Medrol. There was too much resistance. There was no blood loss or complications after the attempted aspiration. He tolerated it well. A  dressing was applied afterwards.  Skin: He is not diaphoretic.    Lab Results  Component Value Date   WBC 7.7 06/01/2013   HGB 14.7 06/01/2013   HCT 42.2 06/01/2013   PLT 264.0 06/01/2013   GLUCOSE 96 06/01/2013   CHOL 133 06/01/2013   TRIG 50.0 06/01/2013   HDL 23.50* 06/01/2013   LDLCALC 100* 06/01/2013   ALT 24 06/01/2013   AST 24 06/01/2013   NA 139 06/01/2013   K 4.5 06/01/2013   CL 102 06/01/2013   CREATININE 1.2 06/01/2013   BUN 17 06/01/2013    CO2 26 06/01/2013   TSH 1.74 06/01/2013   PSA 0.72 06/01/2013    Dg Elbow Complete Right  03/27/2015   CLINICAL DATA:  Status post fall on the right elbow 6-7 weeks ago with persistent pain and soreness with onset of swelling yesterday.  EXAM: RIGHT ELBOW - COMPLETE 3+ VIEW  COMPARISON:  None in PACs  FINDINGS: The bones are adequately mineralized. An united ossification center of the medial epicondyles is present. The radial head and olecranon are intact. The condylar and supracondylar regions of the humerus are unremarkable. There is no joint effusion. There issoft tissue swelling over the extensor surface of the olecranon.  IMPRESSION: Findings most compatible with olecranon bursitis. There is no acute fracture nor dislocation.   Electronically Signed   By: David  [place] M.D.   On: 03/27/2015 16:50    Assessment & Plan:   Diagnoses and all orders for this visit:  Cellulitis of elbow- I'm concerned he has a strep infection after recent abrasion, will treat with Augmentin, the plain x-ray shows no evidence of a foreign body. Orders: -     amoxicillin-clavulanate (AUGMENTIN) 875-125 MG per tablet; Take 1 tablet by mouth 2 (two) times daily. -     DG Elbow Complete Right; Future  Elbow injury, right, initial encounter- the x-ray is positive only for soft tissue swelling consistent with olecranon bursitis. There is no elbow fracture. He will rest, ice, elevate and take some over-the-counter anti-inflammatories and Tylenol for the pain and swelling. Orders: -     DG Elbow Complete Right; Future   I have discontinued Stephen Robles meloxicam, amoxicillin-clavulanate, cyclobenzaprine, fluticasone, and oxyCODONE-acetaminophen. I am also having him start on amoxicillin-clavulanate. Additionally, I am having him maintain his sildenafil.  Meds ordered this encounter  Medications  . amoxicillin-clavulanate (AUGMENTIN) 875-125 MG per tablet    Sig: Take 1 tablet by mouth 2 (two) times daily.     Dispense:  20 tablet    Refill:  1     Follow-up: Return in about 1 week (around 04/03/2015).  Sanda Linger, MD

## 2015-09-04 ENCOUNTER — Ambulatory Visit (INDEPENDENT_AMBULATORY_CARE_PROVIDER_SITE_OTHER): Payer: BLUE CROSS/BLUE SHIELD | Admitting: Ophthalmology

## 2015-09-04 DIAGNOSIS — H338 Other retinal detachments: Secondary | ICD-10-CM | POA: Diagnosis not present

## 2015-09-04 DIAGNOSIS — H43813 Vitreous degeneration, bilateral: Secondary | ICD-10-CM

## 2015-09-04 DIAGNOSIS — H59031 Cystoid macular edema following cataract surgery, right eye: Secondary | ICD-10-CM | POA: Diagnosis not present

## 2015-09-05 ENCOUNTER — Ambulatory Visit (INDEPENDENT_AMBULATORY_CARE_PROVIDER_SITE_OTHER): Payer: BLUE CROSS/BLUE SHIELD | Admitting: Ophthalmology

## 2015-11-30 ENCOUNTER — Ambulatory Visit (INDEPENDENT_AMBULATORY_CARE_PROVIDER_SITE_OTHER): Payer: BLUE CROSS/BLUE SHIELD | Admitting: Internal Medicine

## 2015-11-30 ENCOUNTER — Encounter: Payer: Self-pay | Admitting: Internal Medicine

## 2015-11-30 VITALS — BP 110/72 | HR 79 | Temp 98.0°F | Resp 16 | Wt 190.0 lb

## 2015-11-30 DIAGNOSIS — L239 Allergic contact dermatitis, unspecified cause: Secondary | ICD-10-CM | POA: Insufficient documentation

## 2015-11-30 DIAGNOSIS — L200 Besnier's prurigo: Secondary | ICD-10-CM

## 2015-11-30 MED ORDER — METHYLPREDNISOLONE ACETATE 80 MG/ML IJ SUSP
120.0000 mg | Freq: Once | INTRAMUSCULAR | Status: AC
  Administered 2015-11-30: 120 mg via INTRAMUSCULAR

## 2015-11-30 MED ORDER — CLOBETASOL PROPIONATE 0.05 % EX OINT: 1.0000 "application " | TOPICAL_OINTMENT | Freq: Two times a day (BID) | CUTANEOUS | Status: DC

## 2015-11-30 MED ORDER — DOXEPIN HCL 10 MG PO CAPS: 10.0000 mg | ORAL_CAPSULE | Freq: Every day | ORAL | Status: AC

## 2015-11-30 NOTE — Progress Notes (Signed)
Subjective:  Patient ID: Stephen Robles, male    DOB: 1950-11-10  Age: 65 y.o. MRN: [id number]  CC: Rash   HPI Stephen Robles presents for an intensely itchy rash for 6 weeks. He thinks the rash is caused by the chlorine that is in his new hot tub. He went to [place] 3 weeks ago and while theere he was evaluated for the rash. It looks like they thought he had impetigo and or eczema because he was prescribed amoxicillin, Atarax, Xyzal, oral prednisone, Bactroban ointment, and clobetasol. He is a very poor historian and I can't tell if he is using any of these medications. It sounds like he has tried a topical steroid but not very consistently. He complains of persistent itching.  Outpatient Prescriptions Prior to Visit  Medication Sig Dispense Refill  . sildenafil (VIAGRA) 100 MG tablet Take 1 tablet (100 mg total) by mouth as needed for erectile dysfunction. 10 tablet 11   No facility-administered medications prior to visit.    ROS Review of Systems  Constitutional: Negative.  Negative for fever, chills, diaphoresis, appetite change and fatigue.  HENT: Negative.  Negative for facial swelling, sinus pressure and trouble swallowing.   Eyes: Negative.   Respiratory: Negative.  Negative for cough, choking, chest tightness, shortness of breath and stridor.   Cardiovascular: Negative.  Negative for chest pain, palpitations and leg swelling.  Gastrointestinal: Negative.  Negative for nausea, abdominal pain, diarrhea, constipation and blood in stool.  Endocrine: Negative.   Genitourinary: Negative.   Musculoskeletal: Negative.  Negative for myalgias, back pain and arthralgias.  Skin: Positive for rash. Negative for color change, pallor and wound.  Allergic/Immunologic: Negative.   Neurological: Negative.  Negative for dizziness, tremors, weakness, light-headedness, numbness and headaches.  Hematological: Negative.  Negative for adenopathy. Does not bruise/bleed easily.    Psychiatric/Behavioral: Negative.     Objective:  BP 110/72 mmHg  Pulse 79  Temp(Src) 98 F (36.7 C) (Oral)  Resp 16  Wt 190 lb (86.183 kg)  SpO2 97%  BP Readings from Last 3 Encounters:  11/30/15 110/72  03/27/15 100/60  03/28/14 114/72    Wt Readings from Last 3 Encounters:  11/30/15 190 lb (86.183 kg)  03/27/15 187 lb (84.823 kg)  03/28/14 184 lb 3.2 oz (83.553 kg)    Physical Exam  Constitutional: He is oriented to person, place, and time. He appears well-developed and well-nourished. No distress.  HENT:  Mouth/Throat: Oropharynx is clear and moist. No oropharyngeal exudate.  Eyes: Conjunctivae are normal. Right eye exhibits no discharge. Left eye exhibits no discharge. No scleral icterus.  Neck: Normal range of motion. Neck supple. No JVD present. No tracheal deviation present. No thyromegaly present.  Cardiovascular: Normal rate, normal heart sounds and intact distal pulses.  Exam reveals no gallop and no friction rub.   No murmur heard. Pulmonary/Chest: Effort normal and breath sounds normal. No stridor. No respiratory distress. He has no wheezes. He has no rales. He exhibits no tenderness.  Abdominal: Soft. Bowel sounds are normal. He exhibits no distension and no mass. There is no tenderness. There is no rebound and no guarding.  Musculoskeletal: Normal range of motion. He exhibits no edema or tenderness.  Lymphadenopathy:    He has no cervical adenopathy.  Neurological: He is oriented to person, place, and time.  Skin: Skin is warm and dry. Rash noted. No purpura noted. Rash is papular. Rash is not macular, not maculopapular, not nodular, not pustular, not vesicular and not urticarial. He  is not diaphoretic. No erythema. No pallor.  There are scattered, scaly papules in groups over his anterior and posterior torso. There are also similar lesions on his upper extremity. Some of them have been excoriated.  Psychiatric: He has a normal mood and affect. His behavior is  normal. Judgment and thought content normal.  Vitals reviewed.   Lab Results  Component Value Date   WBC 7.7 06/01/2013   HGB 14.7 06/01/2013   HCT 42.2 06/01/2013   PLT 264.0 06/01/2013   GLUCOSE 96 06/01/2013   CHOL 133 06/01/2013   TRIG 50.0 06/01/2013   HDL 23.50* 06/01/2013   LDLCALC 100* 06/01/2013   ALT 24 06/01/2013   AST 24 06/01/2013   NA 139 06/01/2013   K 4.5 06/01/2013   CL 102 06/01/2013   CREATININE 1.2 06/01/2013   BUN 17 06/01/2013   CO2 26 06/01/2013   TSH 1.74 06/01/2013   PSA 0.72 06/01/2013    Dg Elbow Complete Right  03/27/2015  CLINICAL DATA:  Status post fall on the right elbow 6-7 weeks ago with persistent pain and soreness with onset of swelling yesterday. EXAM: RIGHT ELBOW - COMPLETE 3+ VIEW COMPARISON:  None in PACs FINDINGS: The bones are adequately mineralized. An united ossification center of the medial epicondyles is present. The radial head and olecranon are intact. The condylar and supracondylar regions of the humerus are unremarkable. There is no joint effusion. There issoft tissue swelling over the extensor surface of the olecranon. IMPRESSION: Findings most compatible with olecranon bursitis. There is no acute fracture nor dislocation. Electronically Signed   By: David  [place]azilandJordan M.D.   On: 03/27/2015 16:50    Assessment & Plan:   Melvyn NethLewis was seen today for rash.  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 I have therefore decided to give him an injection of Depo-Medrol and to start him on a potent antihistamine at bedtime. I have asked him to continue clobetasol cream to the affected areas as well. -     doxepin (SINEQUAN) 10 MG capsule; Take 1 capsule (10 mg total) by mouth at bedtime. -     clobetasol ointment (TEMOVATE) 0.05 %; Apply 1 application topically 2 (two) times daily. -     methylPREDNISolone acetate (DEPO-MEDROL)  injection 120 mg; Inject 1.5 mLs (120 mg total) into the muscle once.  I am having Stephen Robles start on doxepin and clobetasol ointment. I am also having him maintain his sildenafil. We administered methylPREDNISolone acetate.  Meds ordered this encounter  Medications  . doxepin (SINEQUAN) 10 MG capsule    Sig: Take 1 capsule (10 mg total) by mouth at bedtime.    Dispense:  90 capsule    Refill:  3  . clobetasol ointment (TEMOVATE) 0.05 %    Sig: Apply 1 application topically 2 (two) times daily.    Dispense:  60 g    Refill:  2  . methylPREDNISolone acetate (DEPO-MEDROL) injection 120 mg    Sig:      Follow-up: Return in about 4 weeks (around 12/28/2015).  Sanda Lingerhomas Lillyonna Armstead, MD

## 2015-11-30 NOTE — Progress Notes (Signed)
Pre visit review using our clinic review tool, if applicable. No additional management support is needed unless otherwise documented below in the visit note. 

## 2015-11-30 NOTE — Patient Instructions (Signed)
Eczema Eczema, also called atopic dermatitis, is a skin disorder that causes inflammation of the skin. It causes a red rash and dry, scaly skin. The skin becomes very itchy. Eczema is generally worse during the cooler winter months and often improves with the warmth of summer. Eczema usually starts showing signs in infancy. Some children outgrow eczema, but it may last through adulthood.  CAUSES  The exact cause of eczema is not known, but it appears to run in families. People with eczema often have a family history of eczema, allergies, asthma, or hay fever. Eczema is not contagious. Flare-ups of the condition may be caused by:   Contact with something you are sensitive or allergic to.   Stress. SIGNS AND SYMPTOMS  Dry, scaly skin.   Red, itchy rash.   Itchiness. This may occur before the skin rash and may be very intense.  DIAGNOSIS  The diagnosis of eczema is usually made based on symptoms and medical history. TREATMENT  Eczema cannot be cured, but symptoms usually can be controlled with treatment and other strategies. A treatment plan might include:  Controlling the itching and scratching.   Use over-the-counter antihistamines as directed for itching. This is especially useful at night when the itching tends to be worse.   Use over-the-counter steroid creams as directed for itching.   Avoid scratching. Scratching makes the rash and itching worse. It may also result in a skin infection (impetigo) due to a break in the skin caused by scratching.   Keeping the skin well moisturized with creams every day. This will seal in moisture and help prevent dryness. Lotions that contain alcohol and water should be avoided because they can dry the skin.   Limiting exposure to things that you are sensitive or allergic to (allergens).   Recognizing situations that cause stress.   Developing a plan to manage stress.  HOME CARE INSTRUCTIONS   Only take over-the-counter or  prescription medicines as directed by your health care provider.   Do not use anything on the skin without checking with your health care provider.   Keep baths or showers short (5 minutes) in warm (not hot) water. Use mild cleansers for bathing. These should be unscented. You may add nonperfumed bath oil to the bath water. It is best to avoid soap and bubble bath.   Immediately after a bath or shower, when the skin is still damp, apply a moisturizing ointment to the entire body. This ointment should be a petroleum ointment. This will seal in moisture and help prevent dryness. The thicker the ointment, the better. These should be unscented.   Keep fingernails cut short. Children with eczema may need to wear soft gloves or mittens at night after applying an ointment.   Dress in clothes made of cotton or cotton blends. Dress lightly, because heat increases itching.   A child with eczema should stay away from anyone with fever blisters or cold sores. The virus that causes fever blisters (herpes simplex) can cause a serious skin infection in children with eczema. SEEK MEDICAL CARE IF:   Your itching interferes with sleep.   Your rash gets worse or is not better within 1 week after starting treatment.   You see pus or soft yellow scabs in the rash area.   You have a fever.   You have a rash flare-up after contact with someone who has fever blisters.    This information is not intended to replace advice given to you by your health care   provider. Make sure you discuss any questions you have with your health care provider.   Document Released: 08/09/2000 Document Revised: 06/02/2013 Document Reviewed: 03/15/2013 Elsevier Interactive Patient Education 2016 [employer]  

## 2016-02-01 ENCOUNTER — Ambulatory Visit (INDEPENDENT_AMBULATORY_CARE_PROVIDER_SITE_OTHER): Payer: BLUE CROSS/BLUE SHIELD | Admitting: Family

## 2016-02-01 ENCOUNTER — Encounter: Payer: Self-pay | Admitting: Family

## 2016-02-01 VITALS — BP 108/70 | HR 80 | Temp 98.0°F | Ht 74.0 in | Wt 199.0 lb

## 2016-02-01 DIAGNOSIS — R21 Rash and other nonspecific skin eruption: Secondary | ICD-10-CM

## 2016-02-01 DIAGNOSIS — L200 Besnier's prurigo: Secondary | ICD-10-CM | POA: Diagnosis not present

## 2016-02-01 DIAGNOSIS — N522 Drug-induced erectile dysfunction: Secondary | ICD-10-CM

## 2016-02-01 DIAGNOSIS — L239 Allergic contact dermatitis, unspecified cause: Secondary | ICD-10-CM

## 2016-02-01 MED ORDER — METHYLPREDNISOLONE ACETATE 80 MG/ML IJ SUSP
120.0000 mg | Freq: Once | INTRAMUSCULAR | Status: AC
  Administered 2016-02-01: 120 mg via INTRAMUSCULAR

## 2016-02-01 MED ORDER — SILDENAFIL CITRATE 100 MG PO TABS: 100.0000 mg | ORAL_TABLET | ORAL | Status: DC | PRN

## 2016-02-01 MED ORDER — CLOBETASOL PROPIONATE 0.05 % EX OINT: 1.0000 "application " | TOPICAL_OINTMENT | Freq: Two times a day (BID) | CUTANEOUS | Status: AC

## 2016-02-01 NOTE — Progress Notes (Signed)
Subjective:    Patient ID: Stephen Robles, male    DOB: Oct 04, 1950, 65 y.o.   MRN: [id number]   Stephen Robles is a 65 y.o. male who presents today for an acute visit.    HPI Comments: Patient presents for recurrent rash which started 6 months ago. He states that in January that his wife and him installed a chlorine hot tub and 'got excited about it and stayed in for 3 hours at a time for many days a week.'  A month later, he went to [place] and broke out in generalized, itchy rash with red dots. Went to the hospital and rash cleared up on oral and topical steriods. He saw PCP 11/30/15 and was given depo medrol  IM, doxepin at bedtime, and clobetasol ointment. Has been taking cetirzine as opposed to doxepin.  Since rash had resolved two months ago, he wanted to see if was hottub causing the rash. After cleaning and draining the hottub, he got back in, and the itchy rash recurred which brings him in today. He plans to convert to a non-chlorine version of hot tub. Denies systemic features including nausea, vomiting, unusual joint pains, fever, chills.  Rash This is a new problem. The current episode started in the past 7 days. The problem is unchanged. The affected locations include the abdomen and left lower leg. The rash is characterized by itchiness. Associated with: installing hot tub. Pertinent negatives include no cough, fever or vomiting. Past treatments include nothing. The treatment provided no relief. His past medical history is significant for allergies and eczema.   Past Medical History  Diagnosis Date  . Anal fissure 07/2010    he saw ? surgeon in [place] and was told he needed surgery but he has not done that due to $  . No pertinent past medical history    Allergies: Review of patient's allergies indicates no known allergies. Current Outpatient Prescriptions on File Prior to Visit  Medication Sig Dispense Refill  . doxepin (SINEQUAN) 10 MG capsule Take 1 capsule (10 mg  total) by mouth at bedtime. 90 capsule 3  . sildenafil (VIAGRA) 100 MG tablet Take 1 tablet (100 mg total) by mouth as needed for erectile dysfunction. 10 tablet 11  . clobetasol ointment (TEMOVATE) 0.05 % Apply 1 application topically 2 (two) times daily. (Patient not taking: Reported on 02/01/2016) 60 g 2   No current facility-administered medications on file prior to visit.    Social History  Substance Use Topics  . Smoking status: Never Smoker   . Smokeless tobacco: Never Used  . Alcohol Use: 1.2 oz/week    2 Cans of beer per week    Review of Systems  Constitutional: Negative for fever and chills.  Respiratory: Negative for cough.   Cardiovascular: Negative for chest pain and palpitations.  Gastrointestinal: Negative for nausea, vomiting and abdominal pain.  Musculoskeletal: Negative for arthralgias.  Skin: Positive for rash.      Objective:    BP 108/70 mmHg  Pulse 72  Temp(Src) 98 F (36.7 C) (Oral)  Ht  (1.88 m)  Wt 199 lb (90.266 kg)  BMI 25.54 kg/m2  SpO2 97%   Physical Exam  Constitutional: He appears well-developed and well-nourished.  Cardiovascular: Regular rhythm and normal heart sounds.   Pulmonary/Chest: Effort normal and breath sounds normal. No respiratory distress. He has no wheezes. He has no rhonchi. He has no rales.  Lymphadenopathy:       Head (left side): No submandibular  and no preauricular adenopathy present.  Neurological: He is alert.  Skin: Skin is warm and dry. Rash noted. Rash is papular. Rash is not maculopapular.  Several scabbed over papular lesions seen left lower extremity, torso. No draining, streaking.   Psychiatric: He has a normal mood and affect. His speech is normal and behavior is normal.  Vitals reviewed.      Assessment & Plan:   1. Rash and nonspecific skin eruption Suspect contact dermatitis from chlorine. Alternately, consider eczema. However due to frequent recurrence of rash, patient and I jointly agreed a  second opinion with dermatology is appropriate. For his symptoms today I gave him Depo-Medrol IM, I advised continued use over-the-counter anti histamine.  - Ambulatory referral to Dermatology - methylPREDNISolone acetate (DEPO-MEDROL) injection 120 mg; Inject 1.5 mLs (120 mg total) into the muscle once. - clobetasol ointment (TEMOVATE) 0.05 %; Apply 1 application topically 2 (two) times daily.  Dispense: 60 g; Refill: 2  3. Drug-induced erectile dysfunction Stable, refilled.  - sildenafil (VIAGRA) 100 MG tablet; Take 1 tablet (100 mg total) by mouth as needed for erectile dysfunction.  Dispense: 10 tablet; Refill: 11    I am having Stephen Robles maintain his sildenafil, doxepin, and clobetasol ointment.   No orders of the defined types were placed in this encounter.     Start medications as prescribed and explained to patient on After Visit Summary ( AVS). Risks, benefits, and alternatives of the medications and treatment plan prescribed today were discussed, and patient expressed understanding.   Education regarding symptom management and diagnosis given to patient.   Follow-up:Plan follow-up and return precautions given if any worsening symptoms or change in condition.   Continue to follow with Sanda Lingerhomas Jones, MD for routine health maintenance.   Stephen Robles and I agreed with plan.   Rennie PlowmanMargaret Clair Bardwell, FNP

## 2016-02-01 NOTE — Progress Notes (Signed)
Pre visit review using our clinic review tool, if applicable. No additional management support is needed unless otherwise documented below in the visit note. 

## 2016-02-01 NOTE — Patient Instructions (Addendum)
Suspect chlorine.   Referral to derm.  If there is no improvement in your symptoms, or if there is any worsening of symptoms, or if you have any additional concerns, please return for re-evaluation; or, if we are closed, consider going to the Emergency Room for evaluation if symptoms urgent.

## 2016-02-12 ENCOUNTER — Encounter: Payer: Self-pay | Admitting: Internal Medicine

## 2016-02-13 ENCOUNTER — Other Ambulatory Visit: Payer: Self-pay | Admitting: Internal Medicine

## 2016-02-14 NOTE — Telephone Encounter (Signed)
Patient was started on viagra (drug induced erectile dysfunction)----do you want him to continue with refills?  And how long before he needs to follow up with us----please advise, thanks

## 2016-02-15 ENCOUNTER — Encounter: Payer: Self-pay | Admitting: Internal Medicine

## 2016-02-15 ENCOUNTER — Ambulatory Visit (INDEPENDENT_AMBULATORY_CARE_PROVIDER_SITE_OTHER): Payer: BLUE CROSS/BLUE SHIELD | Admitting: Internal Medicine

## 2016-02-15 ENCOUNTER — Other Ambulatory Visit (INDEPENDENT_AMBULATORY_CARE_PROVIDER_SITE_OTHER): Payer: BLUE CROSS/BLUE SHIELD

## 2016-02-15 VITALS — BP 116/68 | HR 69 | Temp 97.7°F | Wt 190.0 lb

## 2016-02-15 DIAGNOSIS — E781 Pure hyperglyceridemia: Secondary | ICD-10-CM | POA: Diagnosis not present

## 2016-02-15 DIAGNOSIS — N522 Drug-induced erectile dysfunction: Secondary | ICD-10-CM | POA: Diagnosis not present

## 2016-02-15 DIAGNOSIS — E785 Hyperlipidemia, unspecified: Secondary | ICD-10-CM | POA: Insufficient documentation

## 2016-02-15 DIAGNOSIS — N5201 Erectile dysfunction due to arterial insufficiency: Secondary | ICD-10-CM

## 2016-02-15 DIAGNOSIS — Z0000 Encounter for general adult medical examination without abnormal findings: Secondary | ICD-10-CM | POA: Diagnosis not present

## 2016-02-15 LAB — CBC WITH DIFFERENTIAL/PLATELET
Basophils Absolute: 0.1 10*3/uL (ref 0.0–0.1)
Basophils Relative: 0.7 % (ref 0.0–3.0)
EOS PCT: 1.2 % (ref 0.0–5.0)
Eosinophils Absolute: 0.1 10*3/uL (ref 0.0–0.7)
HCT: 42.6 % (ref 39.0–52.0)
HEMOGLOBIN: 14.8 g/dL (ref 13.0–17.0)
LYMPHS ABS: 2 10*3/uL (ref 0.7–4.0)
Lymphocytes Relative: 21.4 % (ref 12.0–46.0)
MCHC: 34.7 g/dL (ref 30.0–36.0)
MCV: 92.6 fl (ref 78.0–100.0)
MONOS PCT: 8.5 % (ref 3.0–12.0)
Monocytes Absolute: 0.8 10*3/uL (ref 0.1–1.0)
NEUTROS PCT: 68.2 % (ref 43.0–77.0)
Neutro Abs: 6.3 10*3/uL (ref 1.4–7.7)
Platelets: 309 10*3/uL (ref 150.0–400.0)
RBC: 4.6 Mil/uL (ref 4.22–5.81)
RDW: 13.6 % (ref 11.5–15.5)
WBC: 9.2 10*3/uL (ref 4.0–10.5)

## 2016-02-15 LAB — COMPREHENSIVE METABOLIC PANEL
ALK PHOS: 83 U/L (ref 39–117)
ALT: 34 U/L (ref 0–53)
AST: 23 U/L (ref 0–37)
Albumin: 4.2 g/dL (ref 3.5–5.2)
BUN: 18 mg/dL (ref 6–23)
CHLORIDE: 105 meq/L (ref 96–112)
CO2: 30 meq/L (ref 19–32)
Calcium: 9.7 mg/dL (ref 8.4–10.5)
Creatinine, Ser: 0.95 mg/dL (ref 0.40–1.50)
GFR: 84.24 mL/min (ref >60.00)
GLUCOSE: 94 mg/dL (ref 70–99)
POTASSIUM: 4.5 meq/L (ref 3.5–5.1)
Sodium: 137 mEq/L (ref 135–145)
TOTAL PROTEIN: 7.4 g/dL (ref 6.0–8.3)
Total Bilirubin: 0.3 mg/dL (ref 0.2–1.2)

## 2016-02-15 LAB — URINALYSIS, ROUTINE W REFLEX MICROSCOPIC
BILIRUBIN URINE: NEGATIVE
Hgb urine dipstick: NEGATIVE
KETONES UR: NEGATIVE
LEUKOCYTES UA: NEGATIVE
NITRITE: NEGATIVE
PH: 5.5 (ref 5.0–8.0)
RBC / HPF: NONE SEEN (ref 0–?)
SPECIFIC GRAVITY, URINE: 1.025 (ref 1.000–1.030)
TOTAL PROTEIN, URINE-UPE24: NEGATIVE
URINE GLUCOSE: NEGATIVE
Urobilinogen, UA: 0.2 (ref 0.0–1.0)
WBC, UA: NONE SEEN (ref 0–?)

## 2016-02-15 LAB — LIPID PANEL
CHOL/HDL RATIO: 3
Cholesterol: 149 mg/dL (ref 0–200)
HDL: 43.7 mg/dL (ref >39.00)
LDL CALC: 90 mg/dL (ref 0–99)
NONHDL: 105.73
Triglycerides: 79 mg/dL (ref 0.0–149.0)
VLDL: 15.8 mg/dL (ref 0.0–40.0)

## 2016-02-15 LAB — FECAL OCCULT BLOOD, GUAIAC: Fecal Occult Blood: NEGATIVE

## 2016-02-15 LAB — PSA: PSA: 0.72 ng/mL (ref 0.10–4.00)

## 2016-02-15 LAB — TSH: TSH: 0.93 u[IU]/mL (ref 0.35–4.50)

## 2016-02-15 MED ORDER — SILDENAFIL CITRATE 100 MG PO TABS: 100.0000 mg | ORAL_TABLET | ORAL | Status: DC | PRN

## 2016-02-15 NOTE — Progress Notes (Signed)
Pre visit review using our clinic review tool, if applicable. No additional management support is needed unless otherwise documented below in the visit note. 

## 2016-02-15 NOTE — Patient Instructions (Signed)

## 2016-02-15 NOTE — Progress Notes (Signed)
Subjective:  Patient ID: Stephen Robles, male    DOB: 05/24/1951  Age: 65 y.o. MRN: [id number]  CC: Annual Exam   HPI Stephen PileLewis C Marcelli presents for a CPX.  He has a hx of mild hypertriglyceridemia and mild hypercholesterolemia which has responded well to lifestyle modifications. He is very active, does a lot of cardiovascular training, and stays very fit. He's had no recent episodes of chest pain, shortness of breath, dyspnea on exertion, edema, or fatigue.  He also has a history of erectile dysfunction has had a good response to Viagra, he requests a refill on Viagra.  Outpatient Prescriptions Prior to Visit  Medication Sig Dispense Refill  . clobetasol ointment (TEMOVATE) 0.05 % Apply 1 application topically 2 (two) times daily. 60 g 2  . doxepin (SINEQUAN) 10 MG capsule Take 1 capsule (10 mg total) by mouth at bedtime. 90 capsule 3  . sildenafil (VIAGRA) 100 MG tablet Take 0.5-1 tablets (50-100 mg total) by mouth as needed for erectile dysfunction. 8 tablet 11  . sildenafil (VIAGRA) 100 MG tablet Take 1 tablet (100 mg total) by mouth as needed for erectile dysfunction. 10 tablet 11  . VIAGRA 100 MG tablet TAKE ONE TABLET BY MOUTH AS NEEDED FOR ERECTILE DYSFUNCTION. 8 tablet 11   No facility-administered medications prior to visit.    ROS Review of Systems  Constitutional: Negative.  Negative for fever, chills, diaphoresis, appetite change and fatigue.  HENT: Negative.   Eyes: Negative.   Respiratory: Negative.  Negative for cough, shortness of breath and stridor.   Cardiovascular: Negative.  Negative for chest pain, palpitations and leg swelling.  Gastrointestinal: Negative.  Negative for nausea, abdominal pain and constipation.  Endocrine: Negative.   Genitourinary: Negative.  Negative for urgency, frequency, hematuria, enuresis and difficulty urinating.  Musculoskeletal: Negative.  Negative for myalgias, back pain and arthralgias.  Skin: Negative.  Negative for color change  and rash.  Allergic/Immunologic: Negative.   Neurological: Negative.  Negative for dizziness, tremors, weakness, light-headedness and numbness.  Hematological: Negative.  Negative for adenopathy. Does not bruise/bleed easily.  Psychiatric/Behavioral: Negative.     Objective:  BP 116/68 mmHg  Pulse 69  Temp(Src) 97.7 F (36.5 C)  Wt 190 lb (86.183 kg)  SpO2 95%  BP Readings from Last 3 Encounters:  02/15/16 116/68  02/01/16 108/70  11/30/15 110/72    Wt Readings from Last 3 Encounters:  02/15/16 190 lb (86.183 kg)  02/01/16 199 lb (90.266 kg)  11/30/15 190 lb (86.183 kg)    Physical Exam  Constitutional: He is oriented to person, place, and time. No distress.  HENT:  Mouth/Throat: Oropharynx is clear and moist. No oropharyngeal exudate.  Eyes: Conjunctivae are normal. Right eye exhibits no discharge. Left eye exhibits no discharge. No scleral icterus.  Neck: Normal range of motion. Neck supple. No JVD present. No tracheal deviation present. No thyromegaly present.  Cardiovascular: Normal rate, regular rhythm, normal heart sounds and intact distal pulses.  Exam reveals no gallop and no friction rub.   No murmur heard. Pulmonary/Chest: Effort normal and breath sounds normal. No stridor. No respiratory distress. He has no wheezes. He has no rales. He exhibits no tenderness.  Abdominal: Soft. Bowel sounds are normal. He exhibits no distension and no mass. There is no tenderness. There is no rebound and no guarding. Hernia confirmed negative in the right inguinal area and confirmed negative in the left inguinal area.  Genitourinary: Rectum normal, testes normal and penis normal. Rectal exam shows no  external hemorrhoid, no internal hemorrhoid, no fissure, no mass, no tenderness and anal tone normal. Guaiac negative stool. Prostate is not enlarged and not tender. Right testis shows no mass, no swelling and no tenderness. Right testis is descended. Left testis shows no mass, no swelling  and no tenderness. Left testis is descended. Circumcised. No penile erythema or penile tenderness. No discharge found.  Musculoskeletal: Normal range of motion. He exhibits no edema or tenderness.  Lymphadenopathy:    He has no cervical adenopathy.       Right: No inguinal adenopathy present.       Left: No inguinal adenopathy present.  Neurological: He is oriented to person, place, and time.  Skin: Skin is warm and dry. No rash noted. He is not diaphoretic. No erythema. No pallor.  Vitals reviewed.   Lab Results  Component Value Date   WBC 9.2 02/15/2016   HGB 14.8 02/15/2016   HCT 42.6 02/15/2016   PLT 309.0 02/15/2016   GLUCOSE 94 02/15/2016   CHOL 149 02/15/2016   TRIG 79.0 02/15/2016   HDL 43.70 02/15/2016   LDLCALC 90 02/15/2016   ALT 34 02/15/2016   AST 23 02/15/2016   NA 137 02/15/2016   K 4.5 02/15/2016   CL 105 02/15/2016   CREATININE 0.95 02/15/2016   BUN 18 02/15/2016   CO2 30 02/15/2016   TSH 0.93 02/15/2016   PSA 0.72 02/15/2016    Dg Elbow Complete Right  03/27/2015  CLINICAL DATA:  Status post fall on the right elbow 6-7 weeks ago with persistent pain and soreness with onset of swelling yesterday. EXAM: RIGHT ELBOW - COMPLETE 3+ VIEW COMPARISON:  None in PACs FINDINGS: The bones are adequately mineralized. An united ossification center of the medial epicondyles is present. The radial head and olecranon are intact. The condylar and supracondylar regions of the humerus are unremarkable. There is no joint effusion. There issoft tissue swelling over the extensor surface of the olecranon. IMPRESSION: Findings most compatible with olecranon bursitis. There is no acute fracture nor dislocation. Electronically Signed   By: David  [place] M.D.   On: 03/27/2015 16:50    Assessment & Plan:   Khiry was seen today for annual exam.  Diagnoses and all orders for this visit:  Erectile dysfunction due to arterial insufficiency- he has had a good response to Viagra, his exam  and PSA are negative for any concerns of prostate cancer, will continue Viagra as needed. -     sildenafil (VIAGRA) 100 MG tablet; Take 1 tablet (100 mg total) by mouth as needed for erectile dysfunction. -     Urinalysis, Routine w reflex microscopic (not at [facility]); Future -     PSA; Future  Drug-induced erectile dysfunction  Pure hyperglyceridemia- improvement noted with lifestyle modifications. -     Lipid panel; Future  Routine general medical examination at a health care facility  Dyslipidemia, goal LDL below 130- he has achieved his LDL goal and statin therapy is not indicated. -     Lipid panel; Future -     Comprehensive metabolic panel; Future -     CBC with Differential/Platelet; Future -     TSH; Future  I have discontinued Mr. Rockman's VIAGRA. I am also having him maintain his doxepin, clobetasol ointment, and sildenafil.  Meds ordered this encounter  Medications  . sildenafil (VIAGRA) 100 MG tablet    Sig: Take 1 tablet (100 mg total) by mouth as needed for erectile dysfunction.    Dispense:  12 tablet    Refill:  11   See AVS for instructions about healthy living and anticipatory guidance.  Follow-up: Return if symptoms worsen or fail to improve.  Sanda Lingerhomas Mayur Duman, MD

## 2016-02-18 NOTE — Assessment & Plan Note (Signed)

## 2016-02-29 ENCOUNTER — Telehealth: Payer: Self-pay | Admitting: Internal Medicine

## 2016-02-29 NOTE — Telephone Encounter (Signed)
I would consider double booking 10 am tomorrow if really needed.

## 2016-02-29 NOTE — Telephone Encounter (Signed)
Pt called in and would like to know if Dr Katrinka Blazingsmith can work him in tomorrow?  Fluid on the knee

## 2016-02-29 NOTE — Telephone Encounter (Signed)
Spoke to pt, scheduled him tomorrow @1 :15pm.

## 2016-03-01 ENCOUNTER — Encounter: Payer: Self-pay | Admitting: Family Medicine

## 2016-03-01 ENCOUNTER — Ambulatory Visit (INDEPENDENT_AMBULATORY_CARE_PROVIDER_SITE_OTHER): Payer: BLUE CROSS/BLUE SHIELD | Admitting: Family Medicine

## 2016-03-01 ENCOUNTER — Other Ambulatory Visit: Payer: Self-pay

## 2016-03-01 VITALS — BP 116/80 | HR 74 | Wt 183.0 lb

## 2016-03-01 DIAGNOSIS — S83242A Other tear of medial meniscus, current injury, left knee, initial encounter: Secondary | ICD-10-CM

## 2016-03-01 DIAGNOSIS — M1712 Unilateral primary osteoarthritis, left knee: Secondary | ICD-10-CM | POA: Diagnosis not present

## 2016-03-01 DIAGNOSIS — M25562 Pain in left knee: Secondary | ICD-10-CM

## 2016-03-01 DIAGNOSIS — S83249A Other tear of medial meniscus, current injury, unspecified knee, initial encounter: Secondary | ICD-10-CM | POA: Insufficient documentation

## 2016-03-01 NOTE — Assessment & Plan Note (Signed)
Patient given injection today and tolerated the procedure well. We discussed icing regimen and home exercises. We discussed which activities to do in which ones to potentially avoid. Home exercises given. Topical anti-inflammatories given. We discussed if worsening symptoms come back sooner otherwise we'll see him again in 4 weeks.

## 2016-03-01 NOTE — Assessment & Plan Note (Signed)
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 Seems to be mostly of the medial compartment. We discussed icing regimen. Discussed recheck to distilling which was to avoid. Discussed over-the-counter medications a could be beneficial. Follow-up again in 4 weeks. Patient could be a candidate for viscous supplementation if needed in the long run.

## 2016-03-01 NOTE — Progress Notes (Signed)
  CC: Left knee pain  HPI: Patient is a 65 year old gentleman with a past medical history significant for an anterior cruciate ligament repair multiple decades ago on the left knee coming in with acute swelling of the left knee.  Patient was starting to get ready for a race. Patient is having a 7 mile race at the end of the month. Patient states he started to go up and down a hill in a partially had pain on the medial anterior aspect of the knee. Since then has not felt quite right. Denies any instability but more of a soreness. Not responding over-the-counter medications. Patient has not tried any other home modalities. Denies any radiation of the pain. No buckling. Somewhat uncomfortable at night but does not wake him up at night.  Past medical, surgical, family and social history reviewed. Medications reviewed all in the electronic medical record.   Review of Systems: No headache, visual changes, nausea, vomiting, diarrhea, constipation, dizziness, abdominal pain, skin rash, fevers, chills, night sweats, weight loss, swollen lymph nodes, body aches, joint swelling, muscle aches, chest pain, shortness of breath, mood changes.   Objective:    Blood pressure 116/80, pulse 74, weight 183 lb (83.008 kg).   General: No apparent distress alert and oriented x3 mood and affect normal, dressed appropriately.  HEENT: Pupils equal, extraocular movements intact Respiratory: Patient's speak in full sentences and does not appear short of breath Cardiovascular: No lower extremity edema, non tender, no erythema Skin: Warm dry intact with no signs of infection or rash on extremities or on axial skeleton. Abdomen: Soft nontender Neuro: Cranial nerves II through XII are intact, neurovascularly intact in all extremities with 2+ DTRs and 2+ pulses. Lymph: No lymphadenopathy of posterior or anterior cervical chain or axillae bilaterally.  Gait normal with good balance and coordination.  MSK: Non tender with full  range of motion and good stability and symmetric strength and tone of shoulders, elbows, wrist, hip,  and ankles bilaterally.   Knee:left Port hole from patient's previous infection still noted no erythema Tender to palpation over the medial joint line ROM full in flexion and extension and lower leg rotation. Ligaments with solid consistent endpoints including ACL, PCL, LCL, MCL. Positive Mcmurray's, Apley's, and Thessalonian tests. Non painful patellar compression. Patellar glide without crepitus. Patellar and quadriceps tendons unremarkable. Hamstring and quadriceps strength is normal.  l.   MSK US performed of: Left knee This study was ordered, performed, and interpreted by Terrilee FilesZach Larosa Rhines D.O.  Knee: left All structures visualized. Anterior meniscus does have a tear. Seems to be fairly large. Hypoechoic changes and increasing upper flow. Moderate osteophytic changes in the medial compartment Patellar Tendon unremarkable on long and transverse views without effusion. LCL and MCL unremarkable on long and transverse views. No abnormality of origin of medial or lateral head of the gastrocnemius.  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 Left knee was prepped with alcohol swab and utilizing anterolateral approach, patient's left knee space was injected with 4:1  marcaine 0.5%: Kenalog 40mg /dL. Patient tolerated the procedure well without immediate complications.  Impression and Recommendations:     This case required medical decision making of moderate complexity.

## 2016-03-01 NOTE — Patient Instructions (Signed)
Good to see you.  Ice 20 minutes 2 times daily. Usually after activity and before bed. pennsaid pinkie amount topically 2 times daily as needed.  Avoid any twisting Cross train with biking and elliptical  Exercises 3 times a week.  See me again after the race to see how you are doing.

## 2016-07-03 ENCOUNTER — Ambulatory Visit (INDEPENDENT_AMBULATORY_CARE_PROVIDER_SITE_OTHER): Payer: BLUE CROSS/BLUE SHIELD | Admitting: Family Medicine

## 2016-07-03 ENCOUNTER — Encounter: Payer: Self-pay | Admitting: Family Medicine

## 2016-07-03 DIAGNOSIS — M216X1 Other acquired deformities of right foot: Secondary | ICD-10-CM | POA: Insufficient documentation

## 2016-07-03 DIAGNOSIS — M1712 Unilateral primary osteoarthritis, left knee: Secondary | ICD-10-CM | POA: Diagnosis not present

## 2016-07-03 DIAGNOSIS — M216X9 Other acquired deformities of unspecified foot: Secondary | ICD-10-CM | POA: Insufficient documentation

## 2016-07-03 NOTE — Assessment & Plan Note (Signed)
Discussed shoes and proper positioning.

## 2016-07-03 NOTE — Patient Instructions (Signed)
Good to see you  Ice is your friend  Try the taping on the foot.  Vitamin D 2000 IU  Daily  We will call you on the orthotics [employer] orthotics "total support slim" online would be great try amazon.   After being in the orthotics for 2 weeks stay in them and if pain is there still then we will inject the foot  Runa and lets see what happens with the hip as well.

## 2016-07-03 NOTE — Assessment & Plan Note (Signed)
Patient does have overpronation instability noted. Discussed with patient at great length. Patient will be fitted for custom orthotics secondary to the breakdown of his old ones. Seemed to do very well with this. I do think it will be helpful. We discussed other over-the-counter ones a could also be beneficial. We discussed icing regimen and over-the-counter medications. Patient will avoid being barefoot. Worsening pain we'll consider injection.

## 2016-07-03 NOTE — Assessment & Plan Note (Signed)
Stable after injection patient seems to be doing well.
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
## 2016-07-09 ENCOUNTER — Ambulatory Visit: Payer: BLUE CROSS/BLUE SHIELD | Admitting: Family Medicine

## 2016-07-30 ENCOUNTER — Telehealth: Payer: Self-pay | Admitting: Internal Medicine

## 2016-07-30 ENCOUNTER — Ambulatory Visit (INDEPENDENT_AMBULATORY_CARE_PROVIDER_SITE_OTHER): Payer: BLUE CROSS/BLUE SHIELD | Admitting: Family Medicine

## 2016-07-30 DIAGNOSIS — M216X1 Other acquired deformities of right foot: Secondary | ICD-10-CM | POA: Diagnosis not present

## 2016-07-30 DIAGNOSIS — M216X9 Other acquired deformities of unspecified foot: Secondary | ICD-10-CM | POA: Diagnosis not present

## 2016-07-30 NOTE — Telephone Encounter (Signed)
Please follow up on boot ordered.

## 2016-07-30 NOTE — Assessment & Plan Note (Signed)
Placed in orthotics today. Patient will slowly increase wear over the course of next several weeks. We discussed that it can give some mild irritation. Patient come back in 2-4 weeks for further evaluation and treatment.

## 2016-07-30 NOTE — Progress Notes (Signed)
Procedure Note   Patient was fitted for a : Comfort standard, cushioned, semi-rigid orthotic. The orthotic was heated and afterward the patient patient seated position and molded The patient was positioned in subtalar neutral position and 10 degrees of ankle dorsiflexion in a weight bearing stance. After completion of molding, patient did have orthotic management The blank was ground to a stable position for weight bearing. Size: Men;s 11.5 Base: Carbon fiber Additional Posting and Padding: Left foot: 2 medial 300 postings; Right foot: 1 medial 300 posting Patient requested to take home additional postings. Educated patient that he would not be in neutral position if he changed postings. Patient voiced understanding. Patient took two, 290 postings and one, 250 posting. The patient ambulated these, and they were very comfortable.

## 2016-08-09 ENCOUNTER — Telehealth: Payer: Self-pay | Admitting: Internal Medicine

## 2016-08-09 NOTE — Telephone Encounter (Signed)
[facility] States that this insurance will not cover sildenafil (VIAGRA) 100 MG tablet [[id number]][[id number]] until January. His insurance will change then. He is asking that we write sildenafil 20mg  x100 per pharmacy so that he can have filled and covered.

## 2016-08-09 NOTE — Telephone Encounter (Signed)
Please advise 

## 2016-08-11 ENCOUNTER — Other Ambulatory Visit: Payer: Self-pay | Admitting: Internal Medicine

## 2016-08-11 DIAGNOSIS — N5201 Erectile dysfunction due to arterial insufficiency: Secondary | ICD-10-CM

## 2016-08-11 MED ORDER — SILDENAFIL CITRATE 20 MG PO TABS: 20.0000 mg | ORAL_TABLET | Freq: Four times a day (QID) | ORAL | 11 refills | Status: AC

## 2016-08-13 NOTE — Telephone Encounter (Signed)
Pt is calling back about this. Please advise thanks.

## 2016-08-16 NOTE — Telephone Encounter (Signed)
Left detailed message that PCP has sent the request to the pharmacy.

## 2016-09-09 ENCOUNTER — Ambulatory Visit (INDEPENDENT_AMBULATORY_CARE_PROVIDER_SITE_OTHER): Payer: BLUE CROSS/BLUE SHIELD | Admitting: Ophthalmology

## 2017-06-12 IMAGING — CR DG ELBOW COMPLETE 3+V*R*
4 series · 4 of 4 positions shown · non-contrast
Comparison: None in PACs

CLINICAL DATA: Status post fall on the right elbow 6-7 weeks ago
with persistent pain and soreness with onset of swelling yesterday.

EXAM:
RIGHT ELBOW - COMPLETE 3+ VIEW

[view not recorded (1 of 4)]
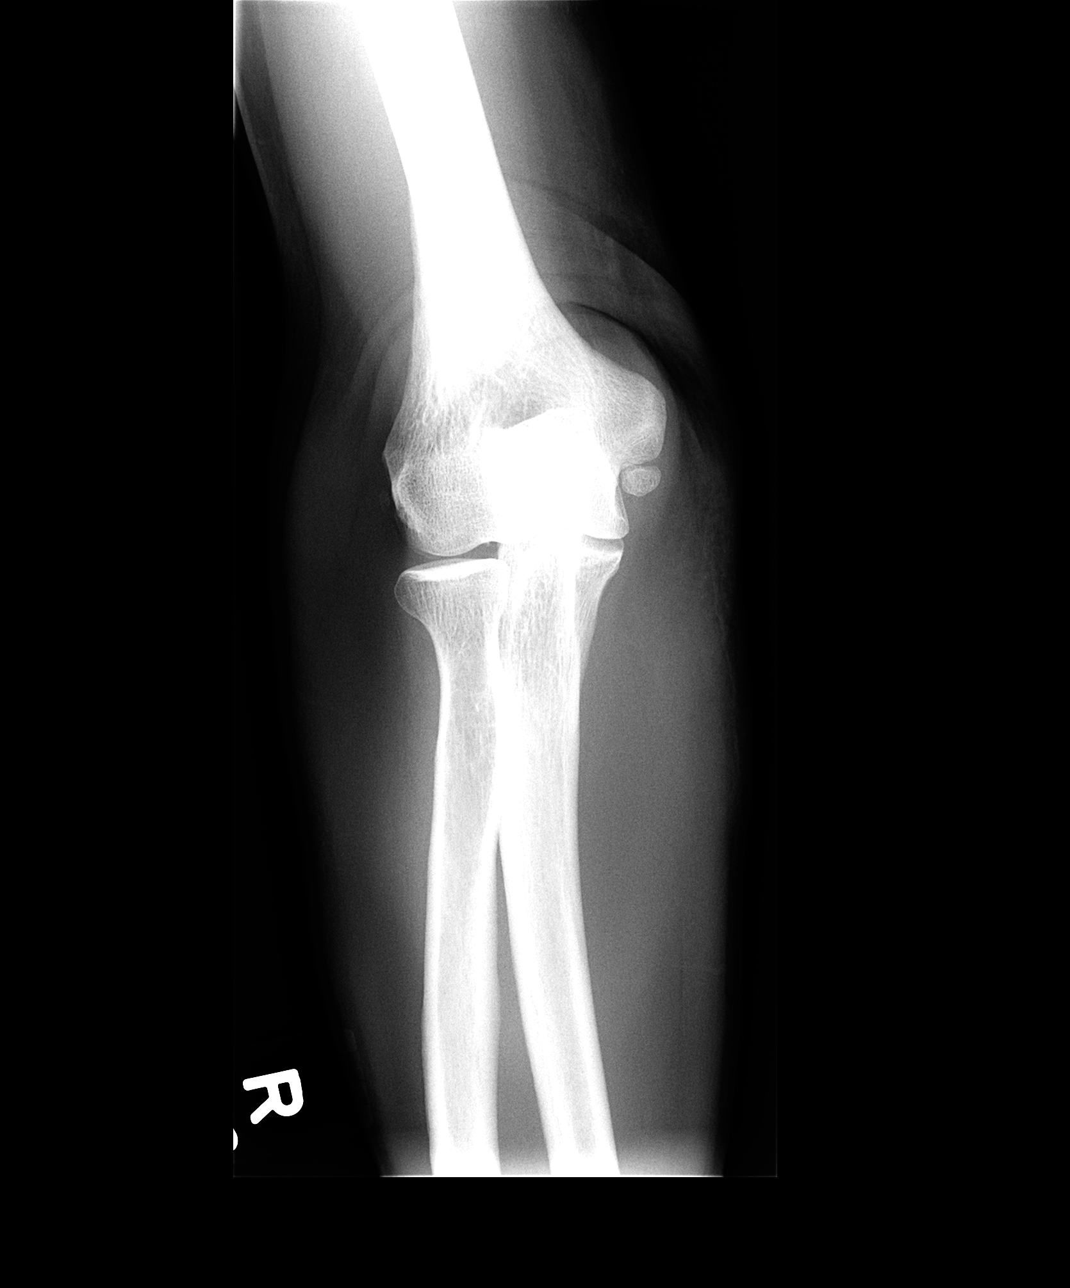

[view not recorded (2 of 4)]
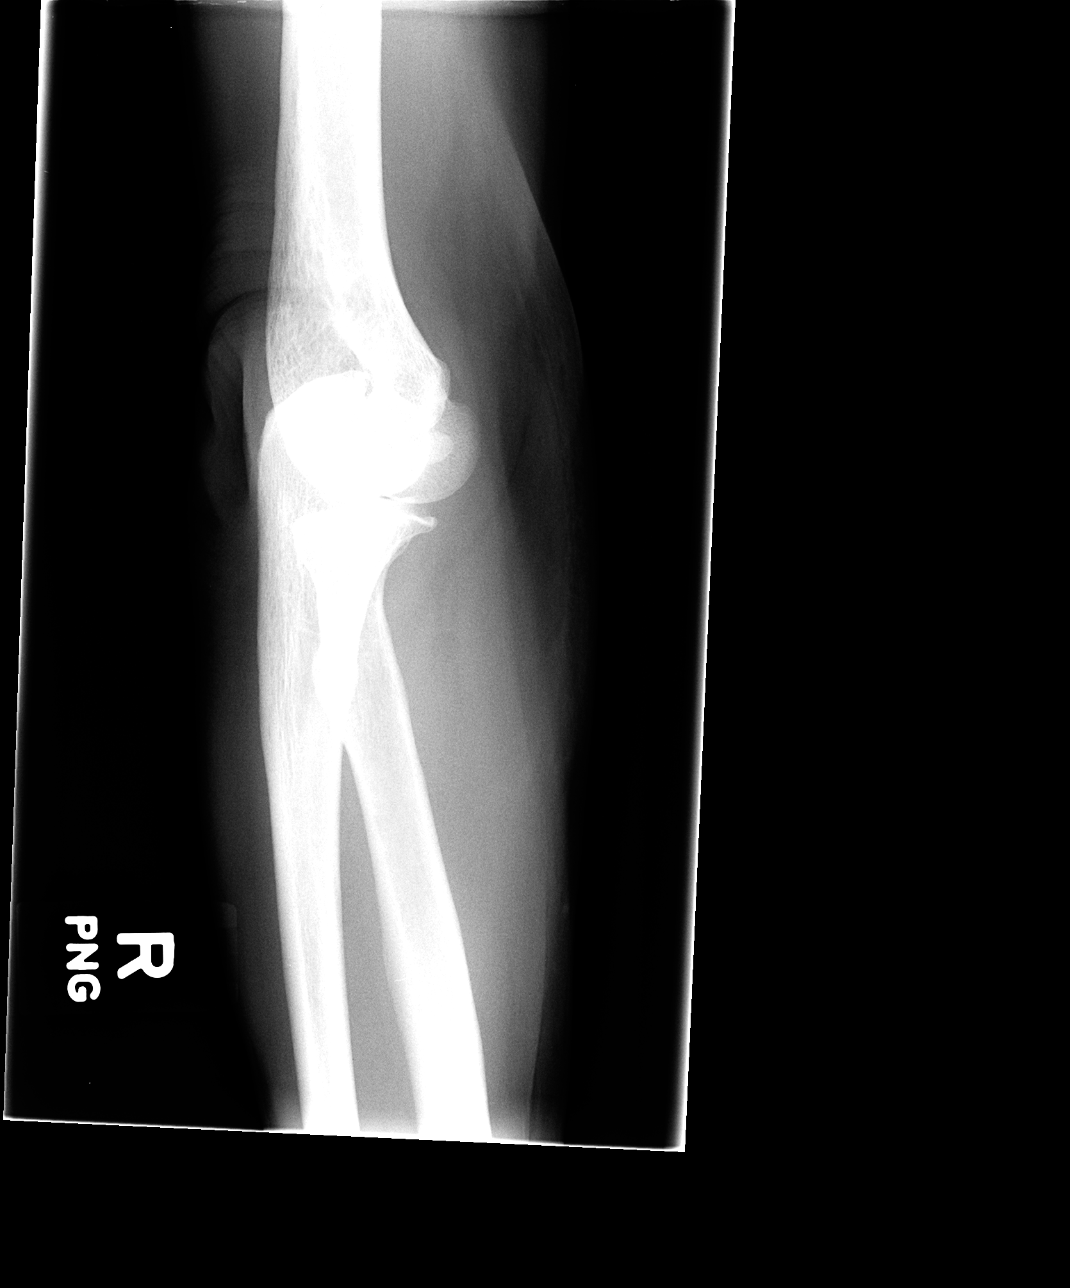

[view not recorded (3 of 4)]
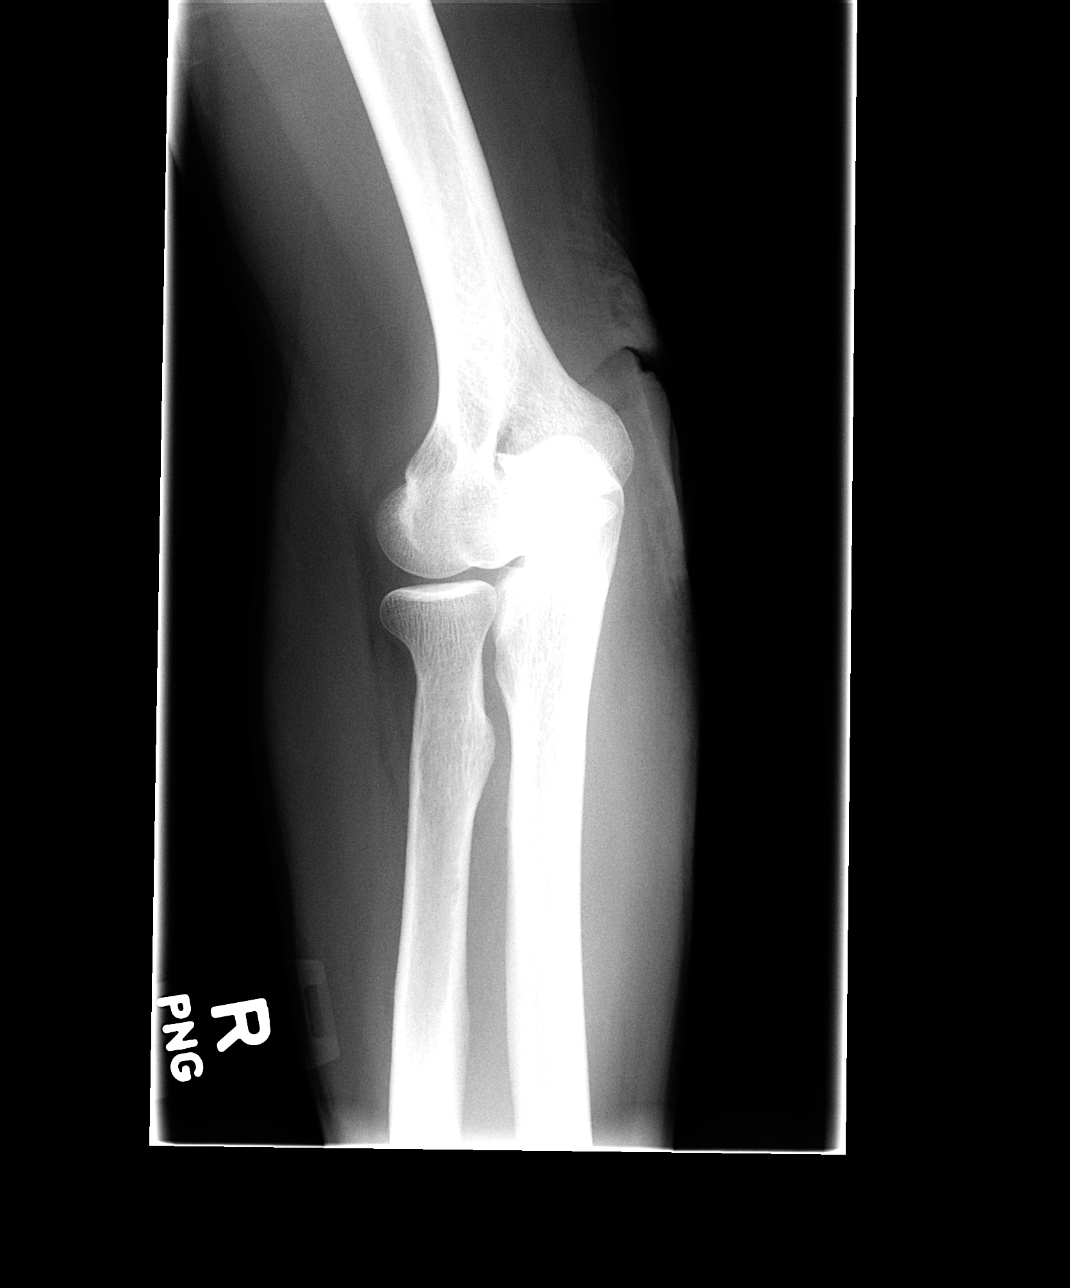

[view not recorded (4 of 4)]
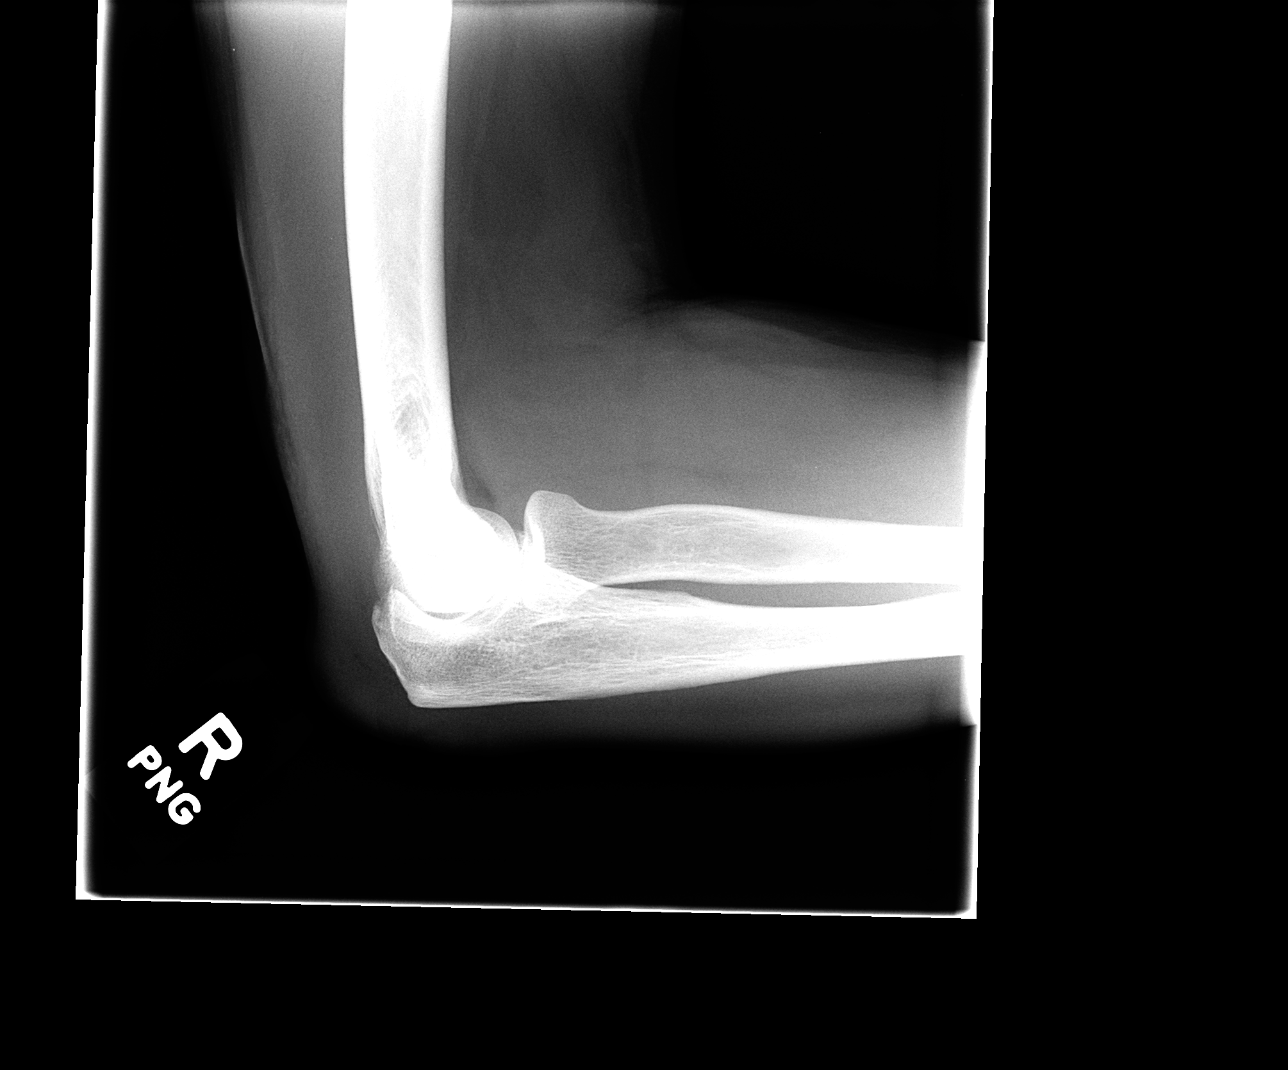

[4 of 4 positions shown; findings below may reference images not displayed]

FINDINGS: The bones are adequately mineralized. An united ossification center
of the medial epicondyles is present. The radial head and olecranon
are intact. The condylar and supracondylar regions of the humerus
are unremarkable. There is no joint effusion. There issoft tissue
swelling over the extensor surface of the olecranon.
IMPRESSION: Findings most compatible with olecranon bursitis. There is no acute
fracture nor dislocation.

## 2017-10-06 ENCOUNTER — Other Ambulatory Visit
Admission: RE | Admit: 2017-10-06 | Discharge: 2017-10-06 | Disposition: A | Discharge status: Home / Self Care | Payer: BLUE CROSS/BLUE SHIELD | Source: Ambulatory Visit | Attending: Internal Medicine | Admitting: Internal Medicine

## 2017-10-06 DIAGNOSIS — R197 Diarrhea, unspecified: Secondary | ICD-10-CM | POA: Insufficient documentation

## 2017-10-06 LAB — BILIRUBIN, TOTAL: Total Bilirubin: 1.2 mg/dL (ref 0.3–1.2)

## 2019-12-28 ENCOUNTER — Other Ambulatory Visit
Admission: RE | Admit: 2019-12-28 | Discharge: 2019-12-28 | Disposition: A | Discharge status: Home / Self Care | Payer: Self-pay | Source: Ambulatory Visit | Attending: Internal Medicine | Admitting: Internal Medicine

## 2019-12-28 DIAGNOSIS — E8809 Other disorders of plasma-protein metabolism, not elsewhere classified: Secondary | ICD-10-CM | POA: Insufficient documentation

## 2019-12-28 LAB — BILIRUBIN, TOTAL: Total Bilirubin: 0.5 mg/dL (ref 0.3–1.2)
